# Patient Record
Sex: Female | Born: 1975 | Race: Black or African American | Hispanic: No | Marital: Married | Smoking: Never smoker
Health system: Southern US, Community
[De-identification: ages and names within clinical notes are randomized; demographics above are authoritative.]

## PROBLEM LIST (undated history)

## (undated) DIAGNOSIS — J383 Other diseases of vocal cords: Secondary | ICD-10-CM

## (undated) DIAGNOSIS — D649 Anemia, unspecified: Secondary | ICD-10-CM

## (undated) DIAGNOSIS — R600 Localized edema: Secondary | ICD-10-CM

## (undated) HISTORY — PX: NO PAST SURGERIES: SHX2092

---

## 2001-06-22 ENCOUNTER — Other Ambulatory Visit: Admission: RE | Admit: 2001-06-22 | Discharge: 2001-06-22 | Payer: Self-pay | Admitting: Internal Medicine

## 2002-12-21 ENCOUNTER — Emergency Department (HOSPITAL_COMMUNITY): Admission: AD | Admit: 2002-12-21 | Discharge: 2002-12-21 | Payer: Self-pay | Admitting: Family Medicine

## 2003-09-20 ENCOUNTER — Emergency Department (HOSPITAL_COMMUNITY): Admission: EM | Admit: 2003-09-20 | Discharge: 2003-09-20 | Payer: Self-pay | Admitting: Family Medicine

## 2004-10-15 ENCOUNTER — Emergency Department (HOSPITAL_COMMUNITY): Admission: EM | Admit: 2004-10-15 | Discharge: 2004-10-15 | Payer: Self-pay | Admitting: Family Medicine

## 2004-11-04 ENCOUNTER — Emergency Department (HOSPITAL_COMMUNITY): Admission: EM | Admit: 2004-11-04 | Discharge: 2004-11-04 | Payer: Self-pay | Admitting: Family Medicine

## 2005-06-09 ENCOUNTER — Emergency Department (HOSPITAL_COMMUNITY): Admission: EM | Admit: 2005-06-09 | Discharge: 2005-06-09 | Payer: Self-pay | Admitting: Family Medicine

## 2007-10-25 ENCOUNTER — Emergency Department (HOSPITAL_COMMUNITY): Admission: EM | Admit: 2007-10-25 | Discharge: 2007-10-25 | Payer: Self-pay | Admitting: Family Medicine

## 2008-10-13 ENCOUNTER — Emergency Department (HOSPITAL_COMMUNITY): Admission: EM | Admit: 2008-10-13 | Discharge: 2008-10-13 | Payer: Self-pay | Admitting: Family Medicine

## 2010-02-21 ENCOUNTER — Emergency Department (HOSPITAL_COMMUNITY)
Admission: EM | Admit: 2010-02-21 | Discharge: 2010-02-21 | Payer: Self-pay | Source: Home / Self Care | Admitting: Family Medicine

## 2012-01-05 ENCOUNTER — Encounter (HOSPITAL_COMMUNITY): Payer: Self-pay | Admitting: Emergency Medicine

## 2012-01-05 ENCOUNTER — Emergency Department (HOSPITAL_COMMUNITY)
Admission: EM | Admit: 2012-01-05 | Discharge: 2012-01-05 | Disposition: A | Discharge status: Home / Self Care | Payer: 59 | Source: Home / Self Care | Attending: Family Medicine | Admitting: Family Medicine

## 2012-01-05 DIAGNOSIS — J069 Acute upper respiratory infection, unspecified: Secondary | ICD-10-CM

## 2012-01-05 MED ORDER — HYDROCOD POLST-CHLORPHEN POLST 10-8 MG/5ML PO LQCR: 5.0000 mL | Freq: Two times a day (BID) | ORAL | Status: DC

## 2012-01-05 MED ORDER — AZITHROMYCIN 250 MG PO TABS: ORAL_TABLET | ORAL | Status: DC

## 2012-01-05 MED ORDER — IPRATROPIUM BROMIDE 0.06 % NA SOLN: 2.0000 | Freq: Four times a day (QID) | NASAL | Status: DC

## 2012-01-05 NOTE — ED Provider Notes (Signed)
History     CSN: [id number]  Arrival date & time 01/05/12  1759   First MD Initiated Contact with Patient 01/05/12 1902      Chief Complaint  Patient presents with  . Cough    (Consider location/radiation/quality/duration/timing/severity/associated sxs/prior treatment) Patient is a 36 y.o. female presenting with cough. The history is provided by the patient.  Cough This is a new problem. The current episode started more than 1 week ago. The problem occurs constantly. The problem has been gradually worsening. The cough is non-productive. There has been no fever. Associated symptoms include rhinorrhea and sore throat. Pertinent negatives include no shortness of breath and no wheezing. She is not a smoker.    History reviewed. No pertinent past medical history.  History reviewed. No pertinent past surgical history.  No family history on file.  History  Substance Use Topics  . Smoking status: Never Smoker   . Smokeless tobacco: Not on file  . Alcohol Use: No    OB History    Grav Para Term Preterm Abortions TAB SAB Ect Mult Living                  Review of Systems  Constitutional: Negative.   HENT: Positive for congestion, sore throat, rhinorrhea and postnasal drip.   Respiratory: Positive for cough. Negative for shortness of breath and wheezing.     Allergies  Review of patient's allergies indicates no known allergies.  Home Medications   Current Outpatient Rx  Name  Route  Sig  Dispense  Refill  . GUAIFENESIN ER 600 MG PO TB12   Oral   Take 1,200 mg by mouth 2 (two) times daily.         Marland Kitchen OVER THE COUNTER MEDICATION      Cough syrups         . AZITHROMYCIN 250 MG PO TABS      Take as directed on pack   6 each   0   . HYDROCOD POLST-CPM POLST ER 10-8 MG/5ML PO LQCR   Oral   Take 5 mLs by mouth every 12 (twelve) hours.   115 mL   0   . IPRATROPIUM BROMIDE 0.06 % NA SOLN   Nasal   Place 2 sprays into the nose 4 (four) times daily.   15 mL  1     BP 111/69  Pulse 81  Temp 98.7 F (37.1 C) (Oral)  Resp 18  SpO2 99%  LMP 12/22/2011  Physical Exam  Nursing note and vitals reviewed. Constitutional: She is oriented to person, place, and time. She appears well-developed and well-nourished.  HENT:  Head: Normocephalic.  Right Ear: External ear normal.  Left Ear: External ear normal.  Nose: Mucosal edema and rhinorrhea present.  Mouth/Throat: Uvula is midline and mucous membranes are normal. Posterior oropharyngeal erythema present.  Neck: Normal range of motion. Neck supple.  Cardiovascular: Normal rate, regular rhythm, normal heart sounds and intact distal pulses.   Pulmonary/Chest: Breath sounds normal.  Lymphadenopathy:    She has no cervical adenopathy.  Neurological: She is alert and oriented to person, place, and time.  Skin: Skin is warm and dry.    ED Course  Procedures (including critical care time)  Labs Reviewed - No data to display No results found.   1. URI (upper respiratory infection)       MDM         Linna Hoff, MD 01/05/12 [phone]

## 2012-01-05 NOTE — ED Notes (Signed)
Placed patient in gown.  Provided warm blankets

## 2012-01-05 NOTE — ED Notes (Signed)
Cough, runny nose for approx one week, yesterday congestion started breaking up, bad taste and yellow phlegm.  Denies fever

## 2013-02-16 ENCOUNTER — Emergency Department (HOSPITAL_COMMUNITY)
Admission: EM | Admit: 2013-02-16 | Discharge: 2013-02-16 | Disposition: A | Discharge status: Home / Self Care | Payer: 59 | Source: Home / Self Care | Attending: Emergency Medicine | Admitting: Emergency Medicine

## 2013-02-16 ENCOUNTER — Encounter (HOSPITAL_COMMUNITY): Payer: Self-pay | Admitting: Emergency Medicine

## 2013-02-16 DIAGNOSIS — R69 Illness, unspecified: Principal | ICD-10-CM

## 2013-02-16 DIAGNOSIS — J111 Influenza due to unidentified influenza virus with other respiratory manifestations: Secondary | ICD-10-CM

## 2013-02-16 LAB — POCT RAPID STREP A: STREPTOCOCCUS, GROUP A SCREEN (DIRECT): NEGATIVE

## 2013-02-16 MED ORDER — IPRATROPIUM BROMIDE 0.06 % NA SOLN: 2.0000 | Freq: Four times a day (QID) | NASAL | Status: DC

## 2013-02-16 MED ORDER — HYDROCOD POLST-CHLORPHEN POLST 10-8 MG/5ML PO LQCR: 5.0000 mL | Freq: Two times a day (BID) | ORAL | Status: DC | PRN

## 2013-02-16 MED ORDER — OSELTAMIVIR PHOSPHATE 75 MG PO CAPS: 75.0000 mg | ORAL_CAPSULE | Freq: Two times a day (BID) | ORAL | Status: DC

## 2013-02-16 NOTE — ED Notes (Signed)
Reports onset of symptoms occurred yesterday.  Scratchy throat, headache, stuffy ears, non - productive cough.  Woke this am feeling bad all over

## 2013-02-16 NOTE — ED Provider Notes (Signed)
  Chief Complaint   Chief Complaint  Patient presents with  . URI    History of Present Illness   [name] is a 38 year old female who has had a two-day history of nonproductive cough, sore throat, aching in the ears, headache, myalgias, chills, nasal congestion, and clear rhinorrhea. She denies any fever or GI symptoms. 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  Review of Systems   Other than as noted above, the patient denies any of the following symptoms: Systemic:  No fevers, chills, sweats, or myalgias. Eye:  No redness or discharge. ENT:  No ear pain, headache, nasal congestion, drainage, sinus pressure, or sore throat. Neck:  No neck pain, stiffness, or swollen glands. Lungs:  No cough, sputum production, hemoptysis, wheezing, chest tightness, shortness of breath or chest pain. GI:  No abdominal pain, nausea, vomiting or diarrhea.  [place]   Past medical history, family history, social history, meds, and allergies were reviewed.  Physical exam   Vital signs:  BP 118/61  Pulse 79  Temp(Src) 98.8 F (37.1 C) (Oral)  Resp 17  SpO2 99%  LMP 02/09/2013 General:  Alert and oriented.  In no distress.  Skin warm and dry. Eye:  No conjunctival injection or drainage. Lids were normal. ENT:  TMs and canals were normal, without erythema or inflammation.  Nasal mucosa was clear and uncongested, without drainage.  Mucous membranes were moist.  Pharynx was clear with no exudate or drainage.  There were no oral ulcerations or lesions. Neck:  Supple, no adenopathy, tenderness or mass. Lungs:  No respiratory distress.  Lungs were clear to auscultation, without wheezes, rales or rhonchi.  Breath sounds were clear and equal bilaterally.  Heart:  Regular rhythm, without gallops, murmers or rubs. Skin:  Clear, warm, and dry, without rash or lesions.  Labs   Results for orders placed during the hospital encounter of 02/16/13  POCT RAPID  STREP A ([facility] ONLY)      Result Value Range   Streptococcus, Group A Screen (Direct) NEGATIVE  NEGATIVE     Radiology   No results found.  Assessment     The encounter diagnosis was Influenza-like illness.  Plan    1.  Meds:  The following meds were prescribed:   New Prescriptions   CHLORPHENIRAMINE-HYDROCODONE (TUSSIONEX) 10-8 MG/5ML LQCR    Take 5 mLs by mouth every 12 (twelve) hours as needed for cough.   IPRATROPIUM (ATROVENT) 0.06 % NASAL SPRAY    Place 2 sprays into both nostrils 4 (four) times daily.   OSELTAMIVIR (TAMIFLU) 75 MG CAPSULE    Take 1 capsule (75 mg total) by mouth every 12 (twelve) hours.    2.  Patient Education/Counseling:  The patient was given appropriate handouts, self care instructions, and instructed in symptomatic relief.  Instructed to get extra fluids, rest, and use a cool mist vaporizer.   3.  Follow up:  The patient was told to follow up here if no better in 3 to 4 days, or sooner if becoming worse in any way, and given some red flag symptoms such as increasing fever, difficulty breathing, chest pain, or persistent vomiting which would prompt immediate return.  Follow up here as needed.      Harden Mo, MD 02/16/13 [phone]

## 2013-02-16 NOTE — Discharge Instructions (Signed)

## 2013-02-18 LAB — CULTURE, GROUP A STREP

## 2014-01-03 ENCOUNTER — Emergency Department (HOSPITAL_COMMUNITY)
Admission: EM | Admit: 2014-01-03 | Discharge: 2014-01-03 | Disposition: A | Discharge status: Home / Self Care | Payer: 59 | Source: Home / Self Care | Attending: Family Medicine | Admitting: Family Medicine

## 2014-01-03 ENCOUNTER — Encounter (HOSPITAL_COMMUNITY): Payer: Self-pay | Admitting: Emergency Medicine

## 2014-01-03 DIAGNOSIS — J40 Bronchitis, not specified as acute or chronic: Secondary | ICD-10-CM

## 2014-01-03 MED ORDER — PREDNISONE 10 MG PO TABS: 30.0000 mg | ORAL_TABLET | Freq: Every day | ORAL | Status: DC

## 2014-01-03 MED ORDER — GUAIFENESIN-CODEINE 100-10 MG/5ML PO SOLN: 5.0000 mL | Freq: Every evening | ORAL | Status: DC | PRN

## 2014-01-03 MED ORDER — IPRATROPIUM BROMIDE 0.06 % NA SOLN: 2.0000 | Freq: Four times a day (QID) | NASAL | Status: DC

## 2014-01-03 NOTE — ED Notes (Signed)
C/o  persistent non productive cough.  Cough worse at night.    Pain in ears with swallowing.  Sore throat.  Post nasal drainage. Symptoms present x 2 to 3 days.  No meds tried.  Denies fever, n/v/d.

## 2014-01-03 NOTE — Discharge Instructions (Signed)
Thank you for coming in today. °Call or go to the emergency room if you get worse, have trouble breathing, have chest pains, or palpitations.  °Do not drive after taking codeine ° °Acute Bronchitis °Bronchitis is inflammation of the airways that extend from the windpipe into the lungs (bronchi). The inflammation often causes mucus to develop. This leads to a cough, which is the most common symptom of bronchitis.  °In acute bronchitis, the condition usually develops suddenly and goes away over time, usually in a couple weeks. Smoking, allergies, and asthma can make bronchitis worse. Repeated episodes of bronchitis may cause further lung problems.  °CAUSES °Acute bronchitis is most often caused by the same virus that causes a cold. The virus can spread from person to person (contagious) through coughing, sneezing, and touching contaminated objects. °SIGNS AND SYMPTOMS  °· Cough.   °· Fever.   °· Coughing up mucus.   °· Body aches.   °· Chest congestion.   °· Chills.   °· Shortness of breath.   °· Sore throat.   °DIAGNOSIS  °Acute bronchitis is usually diagnosed through a physical exam. Your health care provider will also ask you questions about your medical history. Tests, such as chest X-rays, are sometimes done to rule out other conditions.  °TREATMENT  °Acute bronchitis usually goes away in a couple weeks. Oftentimes, no medical treatment is necessary. Medicines are sometimes given for relief of fever or cough. Antibiotic medicines are usually not needed but may be prescribed in certain situations. In some cases, an inhaler may be recommended to help reduce shortness of breath and control the cough. A cool mist vaporizer may also be used to help thin bronchial secretions and make it easier to clear the chest.  °HOME CARE INSTRUCTIONS °· Get plenty of rest.   °· Drink enough fluids to keep your urine clear or pale yellow (unless you have a medical condition that requires fluid restriction). Increasing fluids may  help thin your respiratory secretions (sputum) and reduce chest congestion, and it will prevent dehydration.   °· Take medicines only as directed by your health care provider. °· If you were prescribed an antibiotic medicine, finish it all even if you start to feel better. °· Avoid smoking and secondhand smoke. Exposure to cigarette smoke or irritating chemicals will make bronchitis worse. If you are a smoker, consider using nicotine gum or skin patches to help control withdrawal symptoms. Quitting smoking will help your lungs heal faster.   °· Reduce the chances of another bout of acute bronchitis by washing your hands frequently, avoiding people with cold symptoms, and trying not to touch your hands to your mouth, nose, or eyes.   °· Keep all follow-up visits as directed by your health care provider.   °SEEK MEDICAL CARE IF: °Your symptoms do not improve after 1 week of treatment.  °SEEK IMMEDIATE MEDICAL CARE IF: °· You develop an increased fever or chills.   °· You have chest pain.   °· You have severe shortness of breath. °· You have bloody sputum.   °· You develop dehydration. °· You faint or repeatedly feel like you are going to pass out. °· You develop repeated vomiting. °· You develop a severe headache. °MAKE SURE YOU:  °· Understand these instructions. °· Will watch your condition. °· Will get help right away if you are not doing well or get worse. °Document Released: 02/28/2004 Document Revised: 06/06/2013 Document Reviewed: 07/13/2012 °[employer]® Patient Information ©2015 [employer]. This information is not intended to replace advice given to you by your health care provider. Make sure you discuss   questions you have with your health care provider.

## 2014-01-03 NOTE — ED Provider Notes (Signed)
[name] is a 38 y.o. female who presents to Urgent Care today for cough and congestion. Patient is a 2 to three-day history of cough congestion sore throat. Patient additionally notes a postnasal drip. The cough is nonproductive. She has not tried any medications yet. No vomiting or diarrhea.   History reviewed. No pertinent past medical history. History reviewed. No pertinent past surgical history. History  Substance Use Topics  . Smoking status: Never Smoker   . Smokeless tobacco: Not on file  . Alcohol Use: No   ROS as above Medications: No current facility-administered medications for this encounter.   Current Outpatient Prescriptions  Medication Sig Dispense Refill  . guaiFENesin-codeine 100-10 MG/5ML syrup Take 5 mLs by mouth at bedtime as needed for cough. 120 mL 0  . ipratropium (ATROVENT) 0.06 % nasal spray Place 2 sprays into both nostrils 4 (four) times daily. 15 mL 1  . OVER THE COUNTER MEDICATION Cough syrups    . predniSONE (DELTASONE) 10 MG tablet Take 3 tablets (30 mg total) by mouth daily. 15 tablet 0   No Known Allergies   Exam:  BP 114/68 mmHg  Pulse 63  Temp(Src) 98.4 F (36.9 C) (Oral)  Resp 16  SpO2 97%  LMP 12/27/2013 Gen: Well NAD HEENT: EOMI,  MMM posterior pharynx with cobblestoning. Normal tympanic membranes bilaterally. Lungs: Normal work of breathing. CTABL Heart: RRR no MRG Abd: NABS, Soft. Nondistended, Nontender Exts: Brisk capillary refill, warm and well perfused.   No results found for this or any previous visit (from the past 24 hour(s)). No results found.  Assessment and Plan: 38 y.o. female with bronchitis. Treatment with prednisone Atrovent nasal spray and codeine containing cough medication.  Discussed warning signs or symptoms. Please see discharge instructions. Patient expresses understanding.     Gregor Hams, MD 01/03/14 [phone]

## 2014-10-27 ENCOUNTER — Other Ambulatory Visit: Payer: Self-pay | Admitting: Internal Medicine

## 2014-10-27 DIAGNOSIS — R1900 Intra-abdominal and pelvic swelling, mass and lump, unspecified site: Secondary | ICD-10-CM

## 2014-11-04 DIAGNOSIS — J383 Other diseases of vocal cords: Secondary | ICD-10-CM

## 2014-11-04 HISTORY — DX: Other diseases of vocal cords: J38.3

## 2014-11-07 ENCOUNTER — Ambulatory Visit
Admission: RE | Admit: 2014-11-07 | Discharge: 2014-11-07 | Disposition: A | Discharge status: Home / Self Care | Payer: BLUE CROSS/BLUE SHIELD | Source: Ambulatory Visit | Attending: Internal Medicine | Admitting: Internal Medicine

## 2014-11-07 ENCOUNTER — Encounter (INDEPENDENT_AMBULATORY_CARE_PROVIDER_SITE_OTHER): Payer: Self-pay

## 2014-11-07 DIAGNOSIS — R1900 Intra-abdominal and pelvic swelling, mass and lump, unspecified site: Secondary | ICD-10-CM

## 2014-11-28 ENCOUNTER — Encounter (HOSPITAL_BASED_OUTPATIENT_CLINIC_OR_DEPARTMENT_OTHER): Payer: Self-pay | Admitting: *Deleted

## 2014-11-29 ENCOUNTER — Ambulatory Visit: Payer: Self-pay | Admitting: Otolaryngology

## 2014-11-29 NOTE — H&P (Signed)
Assessment  Hoarseness (784.42) (R49.0). 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 Discussed  Right vocal cord lesion, either granuloma or hemorrhagic cyst. It was likely caused by trauma from excessive voice use. Recommend microlaryngoscopy with laser removal of this at her convenience. Reason For Visit  [name] is here today at the kind request of [name] for consultation and opinion. Hoarseness for over 3 months. HPI  Hoarseness for about 3 months. She had this once before in the past but it got better very quickly. 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 She doesn't smoke or drink. She denies heartburn. She denies any pain or trouble swallowing. Allergies  No Known Drug Allergies. Current Meds  Levocetirizine Dihydrochloride 5 MG Oral Tablet;TAKE 1 TABLET BY MOUTH EVERY DAY AS NEEDED; RPT Multi Vitamin Daily Oral Tablet;; RPT. Active Problems  Fibroids   (218.9) (D25.9). Family Hx  Family history of diabetes mellitus: Father (V18.0) (Z83.3). Personal Hx  Never a smoker. ROS  Systemic: Not feeling tired (fatigue).  No fever, no night sweats, and no recent weight loss. Head: No headache. Eyes: No eye symptoms. Otolaryngeal: No hearing loss, no earache, no tinnitus, and no purulent nasal discharge.  No nasal passage blockage (stuffiness), no snoring, no sneezing, no hoarseness, and no sore throat. Cardiovascular: No chest pain or discomfort  and no palpitations. Pulmonary: No dyspnea, no cough, and no wheezing. Gastrointestinal: No dysphagia  and no heartburn.  No nausea, no abdominal pain, and no melena.  No diarrhea. Genitourinary: No dysuria. Endocrine: No muscle weakness. Musculoskeletal: No calf muscle cramps, no arthralgias, and no soft tissue swelling. Neurological: No dizziness, no fainting, no tingling, and no numbness. Psychological: No anxiety  and no depression. Skin: No rash. 12  system ROS was obtained and reviewed on the Health Maintenance form dated today.  Positive responses are shown above.  If the symptom is not checked, the patient has denied it. Vital Signs   Recorded by Dan Maker on 01 Nov 2014 02:26 PM BP:114/70,  BSA Calculated: 2.03 ,  BMI Calculated: 30.86 ,  Weight: 200 lb , BMI: 30.9 kg/m2,  Height: 5 ft 7.5 in. Physical Exam  APPEARANCE: Well developed, well nourished, in no acute distress.  Normal affect, in a pleasant mood.  Oriented to time, place and person. COMMUNICATION: Coarse and breathy voice   HEAD & FACE:  No scars, lesions or masses of head and face.  Sinuses nontender to palpation.  Salivary glands without mass or tenderness.  Facial strength symmetric.  No facial lesion, scars, or mass. EYES: EOMI with normal primary gaze alignment. Visual acuity grossly intact.  PERRLA EXTERNAL EAR & NOSE: No scars, lesions or masses  EAC & TYMPANIC MEMBRANE:  EAC shows no obstructing lesions or debris and tympanic membranes are normal bilaterally with good movement to insufflation. GROSS HEARING: Normal   TMJ:  Nontender  INTRANASAL EXAM: No polyps or purulence.  NASOPHARYNX: Normal, without lesions. LIPS, TEETH & GUMS: No lip lesions, normal dentition and normal gums. ORAL CAVITY/OROPHARYNX:  Oral mucosa moist without lesion or asymmetry of the palate, tongue, tonsil or posterior pharynx. Indirect laryngoscopy inadequate. NECK:  Supple without adenopathy or mass. THYROID:  Normal with no masses palpable.  NEUROLOGIC:  No gross CN deficits. No nystagmus noted.   LYMPHATIC:  No enlarged nodes palpable. Procedure  Fiberoptic Laryngoscopy Name: [name]     Age: 39 year  The risks and benefits of this procedure have been thoroughly discussed with the patient/parent.  The most commons risks outlined included but were not limited to: injury  to the nasal mucosa or throat irritation.  The patient/parent was further informed that there are  other less common risks.  The patient/parent was given the opportunity to ask questions and all such questions were answered to the patient/parent's satisfaction.  Patient/parent acknowledged the risks and has agreed to proceed.   Performing Provider: Izora Gala The risks of the procedure are minimal and were discussed with the patient today. Pre-op Diagnosis: hoarseness  Post-op Diagnosis:Same Allergy:  reviewed allergies as listed Nasal Prep:Lidocaine/Afrin   Procedure:     With the patient seated in the exam chair, the R nasal cavity was intubated with the flexible laryngoscope.  The nasal cavity mucosa, nasopharynx, hypopharynx and larynx were all examined with findings as noted below.  The scope was then removed.  The patient tolerated the procedure well without complication or blood loss (unless indicated in findings).   FINDINGS:   Either granuloma or hemorrhagic cyst arising from the right mid vocal fold. Remainder of the larynx was completely normal.  . Signature  Electronically signed by : Izora Gala  M.D.; 11/01/2014 2:55 PM EST.

## 2014-11-30 ENCOUNTER — Ambulatory Visit (HOSPITAL_BASED_OUTPATIENT_CLINIC_OR_DEPARTMENT_OTHER)
Admission: RE | Admit: 2014-11-30 | Discharge: 2014-11-30 | Disposition: A | Discharge status: Home / Self Care | Payer: BLUE CROSS/BLUE SHIELD | Source: Ambulatory Visit | Attending: Otolaryngology | Admitting: Otolaryngology

## 2014-11-30 ENCOUNTER — Encounter (HOSPITAL_BASED_OUTPATIENT_CLINIC_OR_DEPARTMENT_OTHER)
Admission: RE | Disposition: A | Discharge status: Home / Self Care | Payer: Self-pay | Source: Ambulatory Visit | Attending: Otolaryngology

## 2014-11-30 ENCOUNTER — Encounter (HOSPITAL_BASED_OUTPATIENT_CLINIC_OR_DEPARTMENT_OTHER): Payer: Self-pay

## 2014-11-30 ENCOUNTER — Ambulatory Visit (HOSPITAL_BASED_OUTPATIENT_CLINIC_OR_DEPARTMENT_OTHER): Payer: BLUE CROSS/BLUE SHIELD | Admitting: Anesthesiology

## 2014-11-30 DIAGNOSIS — J383 Other diseases of vocal cords: Secondary | ICD-10-CM | POA: Diagnosis not present

## 2014-11-30 HISTORY — PX: MICROLARYNGOSCOPY: SHX5208

## 2014-11-30 HISTORY — DX: Other diseases of vocal cords: J38.3

## 2014-11-30 SURGERY — MICROLARYNGOSCOPY
Anesthesia: General | Site: Throat

## 2014-11-30 MED ORDER — PROPOFOL 500 MG/50ML IV EMUL
INTRAVENOUS | Status: AC
  Filled 2014-11-30: qty 50

## 2014-11-30 MED ORDER — ONDANSETRON HCL 4 MG/2ML IJ SOLN: 4.0000 mg | Freq: Once | INTRAMUSCULAR | Status: DC | PRN

## 2014-11-30 MED ORDER — FENTANYL CITRATE (PF) 100 MCG/2ML IJ SOLN
50.0000 ug | INTRAMUSCULAR | Status: DC | PRN
  Administered 2014-11-30: 100 ug via INTRAVENOUS

## 2014-11-30 MED ORDER — IBUPROFEN 200 MG PO TABS
ORAL_TABLET | ORAL | Status: AC
  Filled 2014-11-30: qty 3

## 2014-11-30 MED ORDER — GLYCOPYRROLATE 0.2 MG/ML IJ SOLN: 0.2000 mg | Freq: Once | INTRAMUSCULAR | Status: DC | PRN

## 2014-11-30 MED ORDER — LIDOCAINE-EPINEPHRINE 1 %-1:100000 IJ SOLN
INTRAMUSCULAR | Status: AC
  Filled 2014-11-30: qty 1

## 2014-11-30 MED ORDER — BUPIVACAINE HCL (PF) 0.25 % IJ SOLN
INTRAMUSCULAR | Status: AC
  Filled 2014-11-30: qty 30

## 2014-11-30 MED ORDER — LACTATED RINGERS IV SOLN
INTRAVENOUS | Status: DC
  Administered 2014-11-30 (×2): via INTRAVENOUS

## 2014-11-30 MED ORDER — SUCCINYLCHOLINE CHLORIDE 20 MG/ML IJ SOLN
INTRAMUSCULAR | Status: DC | PRN
  Administered 2014-11-30: 100 mg via INTRAVENOUS

## 2014-11-30 MED ORDER — MIDAZOLAM HCL 2 MG/2ML IJ SOLN
INTRAMUSCULAR | Status: AC
  Filled 2014-11-30: qty 4

## 2014-11-30 MED ORDER — MIDAZOLAM HCL 2 MG/2ML IJ SOLN
1.0000 mg | INTRAMUSCULAR | Status: DC | PRN
  Administered 2014-11-30: 2 mg via INTRAVENOUS

## 2014-11-30 MED ORDER — LIDOCAINE-EPINEPHRINE 1 %-1:100000 IJ SOLN
INTRAMUSCULAR | Status: DC | PRN
  Administered 2014-11-30: .2 mL

## 2014-11-30 MED ORDER — EPINEPHRINE HCL 1 MG/ML IJ SOLN
INTRAMUSCULAR | Status: DC | PRN
  Administered 2014-11-30: .2 mL

## 2014-11-30 MED ORDER — ONDANSETRON HCL 4 MG/2ML IJ SOLN
INTRAMUSCULAR | Status: DC | PRN
  Administered 2014-11-30: 4 mg via INTRAVENOUS

## 2014-11-30 MED ORDER — SCOPOLAMINE 1 MG/3DAYS TD PT72: 1.0000 | MEDICATED_PATCH | Freq: Once | TRANSDERMAL | Status: DC | PRN

## 2014-11-30 MED ORDER — PROPOFOL 500 MG/50ML IV EMUL
INTRAVENOUS | Status: DC | PRN
  Administered 2014-11-30: 100 ug/kg/min via INTRAVENOUS

## 2014-11-30 MED ORDER — ARTIFICIAL TEARS OP OINT
TOPICAL_OINTMENT | OPHTHALMIC | Status: AC
  Filled 2014-11-30: qty 3.5

## 2014-11-30 MED ORDER — LIDOCAINE HCL (CARDIAC) 20 MG/ML IV SOLN
INTRAVENOUS | Status: AC
  Filled 2014-11-30: qty 5

## 2014-11-30 MED ORDER — FENTANYL CITRATE (PF) 100 MCG/2ML IJ SOLN
INTRAMUSCULAR | Status: AC
  Filled 2014-11-30: qty 4

## 2014-11-30 MED ORDER — PROPOFOL 10 MG/ML IV BOLUS
INTRAVENOUS | Status: AC
Start: 2014-11-30 — End: 2014-11-30
  Filled 2014-11-30: qty 20

## 2014-11-30 MED ORDER — ONDANSETRON HCL 4 MG/2ML IJ SOLN
INTRAMUSCULAR | Status: AC
  Filled 2014-11-30: qty 2

## 2014-11-30 MED ORDER — DEXAMETHASONE SODIUM PHOSPHATE 4 MG/ML IJ SOLN
INTRAMUSCULAR | Status: DC | PRN
  Administered 2014-11-30: 10 mg via INTRAVENOUS

## 2014-11-30 MED ORDER — EPINEPHRINE HCL 1 MG/ML IJ SOLN
INTRAMUSCULAR | Status: AC
  Filled 2014-11-30: qty 1

## 2014-11-30 MED ORDER — DEXAMETHASONE SODIUM PHOSPHATE 10 MG/ML IJ SOLN
INTRAMUSCULAR | Status: AC
  Filled 2014-11-30: qty 1

## 2014-11-30 MED ORDER — LIDOCAINE HCL (CARDIAC) 20 MG/ML IV SOLN
INTRAVENOUS | Status: DC | PRN
  Administered 2014-11-30: 50 mg via INTRAVENOUS

## 2014-11-30 MED ORDER — METHYLENE BLUE 1 % INJ SOLN
INTRAMUSCULAR | Status: AC
  Filled 2014-11-30: qty 10

## 2014-11-30 MED ORDER — IBUPROFEN 600 MG PO TABS
600.0000 mg | ORAL_TABLET | Freq: Once | ORAL | Status: AC
  Administered 2014-11-30: 600 mg via ORAL

## 2014-11-30 MED ORDER — MEPERIDINE HCL 25 MG/ML IJ SOLN: 6.2500 mg | INTRAMUSCULAR | Status: DC | PRN

## 2014-11-30 MED ORDER — PROPOFOL 10 MG/ML IV BOLUS
INTRAVENOUS | Status: DC | PRN
  Administered 2014-11-30: 200 mg via INTRAVENOUS

## 2014-11-30 MED ORDER — SUCCINYLCHOLINE CHLORIDE 20 MG/ML IJ SOLN
INTRAMUSCULAR | Status: AC
  Filled 2014-11-30: qty 1

## 2014-11-30 MED ORDER — HYDROMORPHONE HCL 1 MG/ML IJ SOLN: 0.2500 mg | INTRAMUSCULAR | Status: DC | PRN

## 2014-11-30 MED ORDER — BUPIVACAINE-EPINEPHRINE (PF) 0.25% -1:200000 IJ SOLN
INTRAMUSCULAR | Status: AC
  Filled 2014-11-30: qty 30

## 2014-11-30 SURGICAL SUPPLY — 27 items
CANISTER SUCT 1200ML W/VALVE (MISCELLANEOUS) ×4 IMPLANT
GLOVE ECLIPSE 7.5 STRL STRAW (GLOVE) ×4 IMPLANT
GLOVE SURG SS PI 7.0 STRL IVOR (GLOVE) ×3 IMPLANT
GOWN STRL REUS W/ TWL LRG LVL3 (GOWN DISPOSABLE) IMPLANT
GOWN STRL REUS W/ TWL XL LVL3 (GOWN DISPOSABLE) IMPLANT
GOWN STRL REUS W/TWL LRG LVL3 (GOWN DISPOSABLE)
GOWN STRL REUS W/TWL XL LVL3 (GOWN DISPOSABLE)
GUARD TEETH (MISCELLANEOUS) ×3 IMPLANT
MARKER SKIN DUAL TIP RULER LAB (MISCELLANEOUS) IMPLANT
NDL HYPO 18GX1.5 BLUNT FILL (NEEDLE) ×1 IMPLANT
NDL SPNL 22GX7 QUINCKE BK (NEEDLE) IMPLANT
NEEDLE HYPO 18GX1.5 BLUNT FILL (NEEDLE) ×4 IMPLANT
NEEDLE SPNL 22GX7 QUINCKE BK (NEEDLE) ×4 IMPLANT
NS IRRIG 1000ML POUR BTL (IV SOLUTION) ×4 IMPLANT
PATTIES SURGICAL .5 X3 (DISPOSABLE) ×4 IMPLANT
REDUCTION FITTING 1/4 IN (FILTER) IMPLANT
SHEET MEDIUM DRAPE 40X70 STRL (DRAPES) ×4 IMPLANT
SLEEVE SCD COMPRESS KNEE MED (MISCELLANEOUS) ×3 IMPLANT
SOLUTION BUTLER CLEAR DIP (MISCELLANEOUS) ×4 IMPLANT
SPONGE GAUZE 4X4 12PLY STER LF (GAUZE/BANDAGES/DRESSINGS) ×8 IMPLANT
SURGILUBE 2OZ TUBE FLIPTOP (MISCELLANEOUS) IMPLANT
SYR 5ML LL (SYRINGE) ×4 IMPLANT
SYR CONTROL 10ML LL (SYRINGE) IMPLANT
SYR TB 1ML LL NO SAFETY (SYRINGE) IMPLANT
TOWEL OR 17X24 6PK STRL BLUE (TOWEL DISPOSABLE) ×4 IMPLANT
TUBE CONNECTING 20'X1/4 (TUBING) ×1
TUBE CONNECTING 20X1/4 (TUBING) ×3 IMPLANT

## 2014-11-30 NOTE — Interval H&P Note (Signed)
History and Physical Interval Note:  11/30/2014 7:21 AM  [name]  has presented today for surgery, with the diagnosis of lesion on vocal cord  The various methods of treatment have been discussed with the patient and family. After consideration of risks, benefits and other options for treatment, the patient has consented to  Procedure(s): MICROLARYNGOSCOPY WITH CO2 LASER AND EXCISION OF VOCAL CORD LESION (N/A) as a surgical intervention .  [name] history has been reviewed, patient examined, no change in status, stable for surgery.  I have reviewed the patient's chart and labs.  Questions were answered to the patient's satisfaction.     Amando Ishikawa

## 2014-11-30 NOTE — Anesthesia Postprocedure Evaluation (Signed)
  Anesthesia Post-op Note  Patient: [name]  Procedure(s) Performed: Procedure(s): MICROLARYNGOSCOPY WITH EXCISION OF VOCAL CORD LESION (N/A)  Patient Location: PACU  Anesthesia Type:General  Level of Consciousness: awake and alert   Airway and Oxygen Therapy: Patient Spontanous Breathing  Post-op Pain: none  Post-op Assessment: Post-op Vital signs reviewed              Post-op Vital Signs: Reviewed  Last Vitals:  Filed Vitals:   11/30/14 0955  BP: 118/80  Pulse: 65  Temp: 36.9 C  Resp: 16    Complications: No apparent anesthesia complications

## 2014-11-30 NOTE — Anesthesia Procedure Notes (Signed)
Procedure Name: Intubation Date/Time: 11/30/2014 7:40 AM Performed by: Lieutenant Diego Pre-anesthesia Checklist: Patient identified, Emergency Drugs available, Suction available and Patient being monitored Patient Re-evaluated:Patient Re-evaluated prior to inductionOxygen Delivery Method: Circle System Utilized Preoxygenation: Pre-oxygenation with 100% oxygen Intubation Type: IV induction Ventilation: Mask ventilation without difficulty Laryngoscope Size: Miller and 2 Grade View: Grade I Tube type: Oral Tube size: 6.0 mm Number of attempts: 1 Airway Equipment and Method: Stylet and Oral airway Placement Confirmation: ETT inserted through vocal cords under direct vision,  positive ETCO2 and breath sounds checked- equal and bilateral Tube secured with: Tape Dental Injury: Teeth and Oropharynx as per pre-operative assessment

## 2014-11-30 NOTE — Discharge Instructions (Signed)
No singing, screaming, whispering or, straining your voice for 3 weeks. Minimize talking but when you talk do it in a gentle and relaxed tone as possible.    Post Anesthesia Home Care Instructions  Activity: Get plenty of rest for the remainder of the day. A responsible adult should stay with you for 24 hours following the procedure.  For the next 24 hours, DO NOT: -Drive a car -Paediatric nurse -Drink alcoholic beverages -Take any medication unless instructed by your physician -Make any legal decisions or sign important papers.  Meals: Start with liquid foods such as gelatin or soup. Progress to regular foods as tolerated. Avoid greasy, spicy, heavy foods. If nausea and/or vomiting occur, drink only clear liquids until the nausea and/or vomiting subsides. Call your physician if vomiting continues.  Special Instructions/Symptoms: Your throat may feel dry or sore from the anesthesia or the breathing tube placed in your throat during surgery. If this causes discomfort, gargle with warm salt water. The discomfort should disappear within 24 hours.  If you had a scopolamine patch placed behind your ear for the management of post- operative nausea and/or vomiting:  1. The medication in the patch is effective for 72 hours, after which it should be removed.  Wrap patch in a tissue and discard in the trash. Wash hands thoroughly with soap and water. 2. You may remove the patch earlier than 72 hours if you experience unpleasant side effects which may include dry mouth, dizziness or visual disturbances. 3. Avoid touching the patch. Wash your hands with soap and water after contact with the patch.   Call your surgeon if you experience:   1.  Fever over 101.0. 2.  Inability to urinate. 3.  Nausea and/or vomiting. 4.  Extreme swelling or bruising at the surgical site. 5.  Continued bleeding from the incision. 6.  Increased pain, redness or drainage from the incision. 7.  Problems related to  your pain medication. 8. Any change in color, movement and/or sensation 9. Any problems and/or concerns

## 2014-11-30 NOTE — Anesthesia Preprocedure Evaluation (Signed)
Anesthesia Evaluation  Patient identified by MRN, date of birth, ID band Patient awake    Reviewed: Allergy & Precautions, NPO status , Patient's Chart, lab work & pertinent test results  Airway Mallampati: I  TM Distance: >3 FB Neck ROM: Full    Dental   Pulmonary    Pulmonary exam normal        Cardiovascular Normal cardiovascular exam     Neuro/Psych    GI/Hepatic   Endo/Other    Renal/GU      Musculoskeletal   Abdominal   Peds  Hematology   Anesthesia Other Findings   Reproductive/Obstetrics                             Anesthesia Physical Anesthesia Plan  ASA: II  Anesthesia Plan: General   Post-op Pain Management:    Induction: Intravenous  Airway Management Planned: Oral ETT  Additional Equipment:   Intra-op Plan:   Post-operative Plan: Extubation in OR  Informed Consent:   Plan Discussed with: CRNA and Surgeon  Anesthesia Plan Comments:         Anesthesia Quick Evaluation  

## 2014-11-30 NOTE — Op Note (Signed)
OPERATIVE REPORT  DATE OF SURGERY: 11/30/2014  PATIENT:  [name],  39 y.o. female  PRE-OPERATIVE DIAGNOSIS:  lesion on vocal cord  POST-OPERATIVE DIAGNOSIS:  * No post-op diagnosis entered *  PROCEDURE:  Procedure(s): MICROLARYNGOSCOPY WITH EXCISION OF VOCAL CORD LESION  SURGEON:  Beckie Salts, MD  ASSISTANTS: XXX   ANESTHESIA:   General   EBL:  1 ml  DRAINS: None  LOCAL MEDICATIONS USED:  1% Xylocaine with epinephrine and topical adrenaline  SPECIMEN:  Right anterior vocal cord lesion  COUNTS:  Correct  PROCEDURE DETAILS: The patient was taken to the operating room and placed on the operating table in the supine position. Following induction of general endotracheal anesthesia, the table was turned and the patient was draped in a standard fashion. A maxillary tooth protector was used throughout the case. A Dedo laryngoscope was entered into the oral cavity used to visualize the larynx. The lesion was identified and the anterior cord on the right. There was some corresponding edema on the left side from contact. The microscope was used throughout the case. The scope was attached to the [place] stand with the suspension apparatus. The vocal cord was injected with local anesthetic solution. Microlaryngoscopy scissors were used to excise the lesion in its entirety. Topical adrenaline was used on pledgets. There was minimal mucosal loss. No further treatment was needed. The scope was removed. Patient was awakened extubated and transferred to recovery in stable condition.    PATIENT DISPOSITION:  To PACU, stable

## 2014-11-30 NOTE — Transfer of Care (Signed)
Immediate Anesthesia Transfer of Care Note  Patient: [name]  Procedure(s) Performed: Procedure(s): MICROLARYNGOSCOPY WITH EXCISION OF VOCAL CORD LESION (N/A)  Patient Location: PACU  Anesthesia Type:General  Level of Consciousness: awake  Airway & Oxygen Therapy: Patient Spontanous Breathing and Patient connected to face mask oxygen  Post-op Assessment: Report given to RN and Post -op Vital signs reviewed and stable  Post vital signs: Reviewed and stable  Last Vitals:  Filed Vitals:   11/30/14 0659  BP: 118/68  Pulse: 61  Temp: 36.9 C  Resp: 20    Complications: No apparent anesthesia complications

## 2014-11-30 NOTE — H&P (View-Only) (Signed)
Assessment  Hoarseness (784.42) (R49.0). 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 Discussed  Right vocal cord lesion, either granuloma or hemorrhagic cyst. It was likely caused by trauma from excessive voice use. Recommend microlaryngoscopy with laser removal of this at her convenience. Reason For Visit  [name] is here today at the kind request of [name] for consultation and opinion. Hoarseness for over 3 months. HPI  Hoarseness for about 3 months. She had this once before in the past but it got better very quickly. 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 She doesn't smoke or drink. She denies heartburn. She denies any pain or trouble swallowing. Allergies  No Known Drug Allergies. Current Meds  Levocetirizine Dihydrochloride 5 MG Oral Tablet;TAKE 1 TABLET BY MOUTH EVERY DAY AS NEEDED; RPT Multi Vitamin Daily Oral Tablet;; RPT. Active Problems  Fibroids   (218.9) (D25.9). Family Hx  Family history of diabetes mellitus: Father (V18.0) (Z83.3). Personal Hx  Never a smoker. ROS  Systemic: Not feeling tired (fatigue).  No fever, no night sweats, and no recent weight loss. Head: No headache. Eyes: No eye symptoms. Otolaryngeal: No hearing loss, no earache, no tinnitus, and no purulent nasal discharge.  No nasal passage blockage (stuffiness), no snoring, no sneezing, no hoarseness, and no sore throat. Cardiovascular: No chest pain or discomfort  and no palpitations. Pulmonary: No dyspnea, no cough, and no wheezing. Gastrointestinal: No dysphagia  and no heartburn.  No nausea, no abdominal pain, and no melena.  No diarrhea. Genitourinary: No dysuria. Endocrine: No muscle weakness. Musculoskeletal: No calf muscle cramps, no arthralgias, and no soft tissue swelling. Neurological: No dizziness, no fainting, no tingling, and no numbness. Psychological: No anxiety  and no depression. Skin: No rash. 12  system ROS was obtained and reviewed on the Health Maintenance form dated today.  Positive responses are shown above.  If the symptom is not checked, the patient has denied it. Vital Signs   Recorded by Dan Maker on 01 Nov 2014 02:26 PM BP:114/70,  BSA Calculated: 2.03 ,  BMI Calculated: 30.86 ,  Weight: 200 lb , BMI: 30.9 kg/m2,  Height: 5 ft 7.5 in. Physical Exam  APPEARANCE: Well developed, well nourished, in no acute distress.  Normal affect, in a pleasant mood.  Oriented to time, place and person. COMMUNICATION: Coarse and breathy voice   HEAD & FACE:  No scars, lesions or masses of head and face.  Sinuses nontender to palpation.  Salivary glands without mass or tenderness.  Facial strength symmetric.  No facial lesion, scars, or mass. EYES: EOMI with normal primary gaze alignment. Visual acuity grossly intact.  PERRLA EXTERNAL EAR & NOSE: No scars, lesions or masses  EAC & TYMPANIC MEMBRANE:  EAC shows no obstructing lesions or debris and tympanic membranes are normal bilaterally with good movement to insufflation. GROSS HEARING: Normal   TMJ:  Nontender  INTRANASAL EXAM: No polyps or purulence.  NASOPHARYNX: Normal, without lesions. LIPS, TEETH & GUMS: No lip lesions, normal dentition and normal gums. ORAL CAVITY/OROPHARYNX:  Oral mucosa moist without lesion or asymmetry of the palate, tongue, tonsil or posterior pharynx. Indirect laryngoscopy inadequate. NECK:  Supple without adenopathy or mass. THYROID:  Normal with no masses palpable.  NEUROLOGIC:  No gross CN deficits. No nystagmus noted.   LYMPHATIC:  No enlarged nodes palpable. Procedure  Fiberoptic Laryngoscopy Name: Shannette Tabares     Age: 39 year  The risks and benefits of this procedure have been thoroughly discussed with the patient/parent.  The most commons risks outlined included but were not limited to: injury  to the nasal mucosa or throat irritation.  The patient/parent was further informed that there are  other less common risks.  The patient/parent was given the opportunity to ask questions and all such questions were answered to the patient/parent's satisfaction.  Patient/parent acknowledged the risks and has agreed to proceed.   Performing Provider: Izora Gala The risks of the procedure are minimal and were discussed with the patient today. Pre-op Diagnosis: hoarseness  Post-op Diagnosis:Same Allergy:  reviewed allergies as listed Nasal Prep:Lidocaine/Afrin   Procedure:     With the patient seated in the exam chair, the R nasal cavity was intubated with the flexible laryngoscope.  The nasal cavity mucosa, nasopharynx, hypopharynx and larynx were all examined with findings as noted below.  The scope was then removed.  The patient tolerated the procedure well without complication or blood loss (unless indicated in findings).   FINDINGS:   Either granuloma or hemorrhagic cyst arising from the right mid vocal fold. Remainder of the larynx was completely normal.  . Signature  Electronically signed by : Izora Gala  M.D.; 11/01/2014 2:55 PM EST.

## 2014-12-01 ENCOUNTER — Encounter (HOSPITAL_BASED_OUTPATIENT_CLINIC_OR_DEPARTMENT_OTHER): Payer: Self-pay | Admitting: Otolaryngology

## 2015-01-10 ENCOUNTER — Other Ambulatory Visit: Payer: Self-pay | Admitting: Obstetrics and Gynecology

## 2015-02-09 ENCOUNTER — Other Ambulatory Visit (HOSPITAL_COMMUNITY): Payer: Self-pay | Admitting: Obstetrics and Gynecology

## 2015-02-09 DIAGNOSIS — N979 Female infertility, unspecified: Secondary | ICD-10-CM

## 2015-02-13 ENCOUNTER — Ambulatory Visit (HOSPITAL_COMMUNITY)
Admission: RE | Admit: 2015-02-13 | Discharge: 2015-02-13 | Disposition: A | Discharge status: Home / Self Care | Payer: BLUE CROSS/BLUE SHIELD | Source: Ambulatory Visit | Attending: Obstetrics and Gynecology | Admitting: Obstetrics and Gynecology

## 2015-02-13 DIAGNOSIS — D259 Leiomyoma of uterus, unspecified: Secondary | ICD-10-CM | POA: Diagnosis not present

## 2015-02-13 DIAGNOSIS — N979 Female infertility, unspecified: Secondary | ICD-10-CM | POA: Diagnosis not present

## 2015-02-13 MED ORDER — IOHEXOL 300 MG/ML  SOLN
30.0000 mL | Freq: Once | INTRAMUSCULAR | Status: AC | PRN
  Administered 2015-02-13: 30 mL

## 2015-04-26 ENCOUNTER — Other Ambulatory Visit: Payer: Self-pay | Admitting: Obstetrics and Gynecology

## 2015-05-06 NOTE — Anesthesia Preprocedure Evaluation (Addendum)
Anesthesia Evaluation  Patient identified by MRN, date of birth, ID band Patient awake    Reviewed: Allergy & Precautions, NPO status , Patient's Chart, lab work & pertinent test results  Airway Mallampati: I  TM Distance: >3 FB Neck ROM: Full    Dental  (+) Teeth Intact   Pulmonary neg pulmonary ROS,    Pulmonary exam normal breath sounds clear to auscultation       Cardiovascular negative cardio ROS Normal cardiovascular exam Rhythm:Regular     Neuro/Psych negative neurological ROS  negative psych ROS   GI/Hepatic negative GI ROS, Neg liver ROS,   Endo/Other  negative endocrine ROS  Renal/GU negative Renal ROS  negative genitourinary   Musculoskeletal negative musculoskeletal ROS (+)   Abdominal   Peds negative pediatric ROS (+)  Hematology negative hematology ROS (+)   Anesthesia Other Findings Excision lesion vocal cord 2016  Reproductive/Obstetrics negative OB ROS                            Anesthesia Physical Anesthesia Plan  ASA: II  Anesthesia Plan: General   Post-op Pain Management:    Induction: Intravenous  Airway Management Planned: Oral ETT  Additional Equipment:   Intra-op Plan:   Post-operative Plan: Extubation in OR  Informed Consent: I have reviewed the patients History and Physical, chart, labs and discussed the procedure including the risks, benefits and alternatives for the proposed anesthesia with the patient or authorized representative who has indicated his/her understanding and acceptance.     Plan Discussed with:   Anesthesia Plan Comments: (2nd IV, Has had vocal cord nodule removed this past December.  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

## 2015-05-08 NOTE — Patient Instructions (Signed)
Your procedure is scheduled on:  Thursday, May 10, 2015  Enter through the Main Entrance of [facility] at: 11:30 AM  Pick up the phone at the desk and dial [phone].  Call this number if you have problems the morning of surgery: [phone].  Remember: Do NOT eat food:  After Midnight Tonight  Do NOT drink clear liquids after:  9:00 AM day of surgery  Take these medicines the morning of surgery with a SIP OF WATER:  None  Do NOT wear jewelry (body piercing), metal hair clips/bobby pins, make-up, or nail polish. Do NOT wear lotions, powders, or perfumes.  You may wear deodorant. Do NOT shave for 48 hours prior to surgery. Do NOT bring valuables to the hospital. Contacts, dentures, or bridgework may not be worn into surgery.  Leave suitcase in car.  After surgery it may be brought to your room.  For patients admitted to the hospital, checkout time is 11:00 AM the day of discharge.

## 2015-05-09 ENCOUNTER — Encounter (HOSPITAL_COMMUNITY)
Admission: RE | Admit: 2015-05-09 | Discharge: 2015-05-09 | Disposition: A | Discharge status: Home / Self Care | Payer: BLUE CROSS/BLUE SHIELD | Source: Ambulatory Visit | Attending: Obstetrics and Gynecology | Admitting: Obstetrics and Gynecology

## 2015-05-09 ENCOUNTER — Encounter (HOSPITAL_COMMUNITY): Payer: Self-pay

## 2015-05-09 HISTORY — DX: Localized edema: R60.0

## 2015-05-09 HISTORY — DX: Anemia, unspecified: D64.9

## 2015-05-09 LAB — CBC
HEMATOCRIT: 31.9 % — AB (ref 36.0–46.0)
Hemoglobin: 10.5 g/dL — ABNORMAL LOW (ref 12.0–15.0)
MCH: 27.6 pg (ref 26.0–34.0)
MCHC: 32.9 g/dL (ref 30.0–36.0)
MCV: 83.7 fL (ref 78.0–100.0)
PLATELETS: 289 10*3/uL (ref 150–400)
RBC: 3.81 MIL/uL — AB (ref 3.87–5.11)
RDW: 15.4 % (ref 11.5–15.5)
WBC: 5.7 10*3/uL (ref 4.0–10.5)

## 2015-05-09 LAB — ABO/RH: ABO/RH(D): A POS

## 2015-05-10 ENCOUNTER — Inpatient Hospital Stay (HOSPITAL_COMMUNITY): Payer: BLUE CROSS/BLUE SHIELD | Admitting: Anesthesiology

## 2015-05-10 ENCOUNTER — Inpatient Hospital Stay (HOSPITAL_COMMUNITY)
Admission: RE | Admit: 2015-05-10 | Discharge: 2015-05-12 | DRG: 742 | Disposition: A | Discharge status: Home / Self Care | Payer: BLUE CROSS/BLUE SHIELD | Source: Ambulatory Visit | Attending: Obstetrics and Gynecology | Admitting: Obstetrics and Gynecology

## 2015-05-10 ENCOUNTER — Encounter (HOSPITAL_COMMUNITY): Payer: Self-pay

## 2015-05-10 ENCOUNTER — Encounter (HOSPITAL_COMMUNITY)
Admission: RE | Disposition: A | Discharge status: Home / Self Care | Payer: Self-pay | Source: Ambulatory Visit | Attending: Obstetrics and Gynecology

## 2015-05-10 DIAGNOSIS — D509 Iron deficiency anemia, unspecified: Secondary | ICD-10-CM

## 2015-05-10 DIAGNOSIS — N838 Other noninflammatory disorders of ovary, fallopian tube and broad ligament: Secondary | ICD-10-CM | POA: Diagnosis present

## 2015-05-10 DIAGNOSIS — D252 Subserosal leiomyoma of uterus: Principal | ICD-10-CM | POA: Diagnosis present

## 2015-05-10 DIAGNOSIS — N920 Excessive and frequent menstruation with regular cycle: Secondary | ICD-10-CM | POA: Diagnosis present

## 2015-05-10 DIAGNOSIS — D62 Acute posthemorrhagic anemia: Secondary | ICD-10-CM | POA: Diagnosis not present

## 2015-05-10 DIAGNOSIS — N800 Endometriosis of uterus: Secondary | ICD-10-CM | POA: Diagnosis present

## 2015-05-10 DIAGNOSIS — D259 Leiomyoma of uterus, unspecified: Secondary | ICD-10-CM | POA: Diagnosis present

## 2015-05-10 DIAGNOSIS — Z9889 Other specified postprocedural states: Secondary | ICD-10-CM

## 2015-05-10 HISTORY — PX: MYOMECTOMY: SHX85

## 2015-05-10 LAB — PREPARE RBC (CROSSMATCH)

## 2015-05-10 LAB — PREGNANCY, URINE: Preg Test, Ur: NEGATIVE

## 2015-05-10 SURGERY — MYOMECTOMY, ABDOMINAL APPROACH
Anesthesia: General | Site: Abdomen

## 2015-05-10 MED ORDER — SODIUM CHLORIDE 0.9 % IJ SOLN
INTRAMUSCULAR | Status: AC
  Filled 2015-05-10: qty 100

## 2015-05-10 MED ORDER — FENTANYL CITRATE (PF) 250 MCG/5ML IJ SOLN
INTRAMUSCULAR | Status: AC
  Filled 2015-05-10: qty 5

## 2015-05-10 MED ORDER — SODIUM CHLORIDE 0.9 % IJ SOLN
INTRAMUSCULAR | Status: AC
  Filled 2015-05-10: qty 10

## 2015-05-10 MED ORDER — ONDANSETRON HCL 4 MG/2ML IJ SOLN: 4.0000 mg | Freq: Four times a day (QID) | INTRAMUSCULAR | Status: DC | PRN

## 2015-05-10 MED ORDER — MIDAZOLAM HCL 2 MG/2ML IJ SOLN
INTRAMUSCULAR | Status: DC | PRN
Start: 2015-05-10 — End: 2015-05-10
  Administered 2015-05-10: 2 mg via INTRAVENOUS

## 2015-05-10 MED ORDER — DEXAMETHASONE SODIUM PHOSPHATE 4 MG/ML IJ SOLN
INTRAMUSCULAR | Status: AC
  Filled 2015-05-10: qty 1

## 2015-05-10 MED ORDER — PANTOPRAZOLE SODIUM 40 MG PO TBEC
40.0000 mg | DELAYED_RELEASE_TABLET | Freq: Every day | ORAL | Status: DC
  Administered 2015-05-10 – 2015-05-12 (×2): 40 mg via ORAL
  Filled 2015-05-10 (×2): qty 1

## 2015-05-10 MED ORDER — ROCURONIUM BROMIDE 100 MG/10ML IV SOLN
INTRAVENOUS | Status: DC | PRN
Start: 2015-05-10 — End: 2015-05-10
  Administered 2015-05-10 (×2): 10 mg via INTRAVENOUS
  Administered 2015-05-10: 30 mg via INTRAVENOUS
  Administered 2015-05-10: 5 mg via INTRAVENOUS

## 2015-05-10 MED ORDER — DIPHENHYDRAMINE HCL 50 MG/ML IJ SOLN: 12.5000 mg | Freq: Four times a day (QID) | INTRAMUSCULAR | Status: DC | PRN

## 2015-05-10 MED ORDER — PROPOFOL 10 MG/ML IV BOLUS
INTRAVENOUS | Status: AC
  Filled 2015-05-10: qty 20

## 2015-05-10 MED ORDER — BUPIVACAINE HCL (PF) 0.25 % IJ SOLN
INTRAMUSCULAR | Status: AC
  Filled 2015-05-10: qty 30

## 2015-05-10 MED ORDER — ROCURONIUM BROMIDE 100 MG/10ML IV SOLN
INTRAVENOUS | Status: AC
  Filled 2015-05-10: qty 1

## 2015-05-10 MED ORDER — NEOSTIGMINE METHYLSULFATE 10 MG/10ML IV SOLN
INTRAVENOUS | Status: DC | PRN
  Administered 2015-05-10: 2 mg via INTRAVENOUS

## 2015-05-10 MED ORDER — VASOPRESSIN 20 UNIT/ML IV SOLN
INTRAVENOUS | Status: DC | PRN
  Administered 2015-05-10: 30 mL via INTRAMUSCULAR
  Administered 2015-05-10: 20 mL via INTRAMUSCULAR

## 2015-05-10 MED ORDER — ONDANSETRON HCL 4 MG/2ML IJ SOLN
INTRAMUSCULAR | Status: AC
  Filled 2015-05-10: qty 2

## 2015-05-10 MED ORDER — GLYCOPYRROLATE 0.2 MG/ML IJ SOLN
INTRAMUSCULAR | Status: DC | PRN
  Administered 2015-05-10: 0.4 mg via INTRAVENOUS

## 2015-05-10 MED ORDER — FENTANYL CITRATE (PF) 100 MCG/2ML IJ SOLN
INTRAMUSCULAR | Status: AC
  Administered 2015-05-10: 50 ug via INTRAVENOUS
  Filled 2015-05-10: qty 2

## 2015-05-10 MED ORDER — GLYCOPYRROLATE 0.2 MG/ML IJ SOLN
INTRAMUSCULAR | Status: AC
  Filled 2015-05-10: qty 3

## 2015-05-10 MED ORDER — ONDANSETRON HCL 4 MG PO TABS: 4.0000 mg | ORAL_TABLET | Freq: Four times a day (QID) | ORAL | Status: DC | PRN

## 2015-05-10 MED ORDER — CEFAZOLIN SODIUM-DEXTROSE 2-4 GM/100ML-% IV SOLN
2.0000 g | INTRAVENOUS | Status: AC
  Administered 2015-05-10: 2 g via INTRAVENOUS
  Filled 2015-05-10: qty 100

## 2015-05-10 MED ORDER — DEXTROSE IN LACTATED RINGERS 5 % IV SOLN
INTRAVENOUS | Status: DC
Start: 2015-05-10 — End: 2015-05-12
  Administered 2015-05-11: 02:00:00 via INTRAVENOUS

## 2015-05-10 MED ORDER — HYDROMORPHONE HCL 1 MG/ML IJ SOLN
INTRAMUSCULAR | Status: AC
  Filled 2015-05-10: qty 1

## 2015-05-10 MED ORDER — NALOXONE HCL 0.4 MG/ML IJ SOLN: 0.4000 mg | INTRAMUSCULAR | Status: DC | PRN

## 2015-05-10 MED ORDER — FENTANYL CITRATE (PF) 100 MCG/2ML IJ SOLN
INTRAMUSCULAR | Status: DC | PRN
  Administered 2015-05-10: 100 ug via INTRAVENOUS
  Administered 2015-05-10: 50 ug via INTRAVENOUS
  Administered 2015-05-10: 100 ug via INTRAVENOUS

## 2015-05-10 MED ORDER — VASOPRESSIN 20 UNIT/ML IV SOLN
INTRAVENOUS | Status: AC
  Filled 2015-05-10: qty 1

## 2015-05-10 MED ORDER — IBUPROFEN 800 MG PO TABS
800.0000 mg | ORAL_TABLET | Freq: Three times a day (TID) | ORAL | Status: DC | PRN
  Administered 2015-05-12: 800 mg via ORAL
  Filled 2015-05-10: qty 1

## 2015-05-10 MED ORDER — DEXAMETHASONE SODIUM PHOSPHATE 10 MG/ML IJ SOLN
INTRAMUSCULAR | Status: DC | PRN
  Administered 2015-05-10: 10 mg via INTRAVENOUS

## 2015-05-10 MED ORDER — MIDAZOLAM HCL 2 MG/2ML IJ SOLN
INTRAMUSCULAR | Status: AC
  Filled 2015-05-10: qty 2

## 2015-05-10 MED ORDER — SCOPOLAMINE 1 MG/3DAYS TD PT72
1.0000 | MEDICATED_PATCH | Freq: Once | TRANSDERMAL | Status: DC
  Administered 2015-05-10: 1.5 mg via TRANSDERMAL
  Filled 2015-05-10: qty 1

## 2015-05-10 MED ORDER — METHYLENE BLUE 1 % INJ SOLN
INTRAMUSCULAR | Status: AC
  Filled 2015-05-10: qty 1

## 2015-05-10 MED ORDER — LIDOCAINE HCL (CARDIAC) 20 MG/ML IV SOLN
INTRAVENOUS | Status: AC
  Filled 2015-05-10: qty 5

## 2015-05-10 MED ORDER — HYDROMORPHONE HCL 1 MG/ML IJ SOLN: 0.2000 mg | INTRAMUSCULAR | Status: DC | PRN

## 2015-05-10 MED ORDER — LACTATED RINGERS IV SOLN
INTRAVENOUS | Status: DC
  Administered 2015-05-10 (×4): via INTRAVENOUS

## 2015-05-10 MED ORDER — CEFAZOLIN SODIUM-DEXTROSE 2-3 GM-% IV SOLR
INTRAVENOUS | Status: AC
  Filled 2015-05-10: qty 50

## 2015-05-10 MED ORDER — LIDOCAINE HCL (CARDIAC) 20 MG/ML IV SOLN
INTRAVENOUS | Status: DC | PRN
  Administered 2015-05-10: 100 mg via INTRAVENOUS

## 2015-05-10 MED ORDER — ONDANSETRON HCL 4 MG/2ML IJ SOLN
INTRAMUSCULAR | Status: DC | PRN
  Administered 2015-05-10: 4 mg via INTRAVENOUS

## 2015-05-10 MED ORDER — SODIUM CHLORIDE 0.9% FLUSH: 9.0000 mL | INTRAVENOUS | Status: DC | PRN

## 2015-05-10 MED ORDER — MENTHOL 3 MG MT LOZG
1.0000 | LOZENGE | OROMUCOSAL | Status: DC | PRN
  Filled 2015-05-10: qty 9

## 2015-05-10 MED ORDER — SIMETHICONE 80 MG PO CHEW
80.0000 mg | CHEWABLE_TABLET | Freq: Four times a day (QID) | ORAL | Status: DC | PRN
  Administered 2015-05-11 – 2015-05-12 (×4): 80 mg via ORAL
  Filled 2015-05-10 (×4): qty 1

## 2015-05-10 MED ORDER — DIPHENHYDRAMINE HCL 12.5 MG/5ML PO ELIX: 12.5000 mg | ORAL_SOLUTION | Freq: Four times a day (QID) | ORAL | Status: DC | PRN

## 2015-05-10 MED ORDER — OXYCODONE-ACETAMINOPHEN 5-325 MG PO TABS
1.0000 | ORAL_TABLET | ORAL | Status: DC | PRN
Start: 2015-05-10 — End: 2015-05-12
  Administered 2015-05-11 (×2): 2 via ORAL
  Administered 2015-05-12: 1 via ORAL
  Administered 2015-05-12: 2 via ORAL
  Filled 2015-05-10: qty 2
  Filled 2015-05-10: qty 1
  Filled 2015-05-10 (×2): qty 2

## 2015-05-10 MED ORDER — VASOPRESSIN 20 UNIT/ML IV SOLN
INTRAVENOUS | Status: AC
  Filled 2015-05-10: qty 3

## 2015-05-10 MED ORDER — SODIUM CHLORIDE 0.9 % IV SOLN: Freq: Once | INTRAVENOUS | Status: DC

## 2015-05-10 MED ORDER — HYDROMORPHONE 1 MG/ML IV SOLN
INTRAVENOUS | Status: DC
  Administered 2015-05-10: 2.6 mg via INTRAVENOUS
  Administered 2015-05-10: 19:00:00 via INTRAVENOUS
  Administered 2015-05-11 (×2): 1 mg via INTRAVENOUS
  Filled 2015-05-10: qty 25

## 2015-05-10 MED ORDER — SCOPOLAMINE 1 MG/3DAYS TD PT72
MEDICATED_PATCH | TRANSDERMAL | Status: AC
  Administered 2015-05-10: 1.5 mg via TRANSDERMAL
  Filled 2015-05-10: qty 1

## 2015-05-10 MED ORDER — ONDANSETRON HCL 4 MG/2ML IJ SOLN: 4.0000 mg | Freq: Four times a day (QID) | INTRAMUSCULAR | Status: DC

## 2015-05-10 MED ORDER — HYDROMORPHONE HCL 1 MG/ML IJ SOLN
INTRAMUSCULAR | Status: DC | PRN
  Administered 2015-05-10: 1 mg via INTRAVENOUS

## 2015-05-10 MED ORDER — FENTANYL CITRATE (PF) 100 MCG/2ML IJ SOLN
25.0000 ug | INTRAMUSCULAR | Status: DC | PRN
  Administered 2015-05-10 (×3): 50 ug via INTRAVENOUS

## 2015-05-10 MED ORDER — PROPOFOL 10 MG/ML IV BOLUS
INTRAVENOUS | Status: DC | PRN
  Administered 2015-05-10: 160 mg via INTRAVENOUS

## 2015-05-10 MED ORDER — NEOSTIGMINE METHYLSULFATE 10 MG/10ML IV SOLN
INTRAVENOUS | Status: AC
  Filled 2015-05-10: qty 1

## 2015-05-10 MED ORDER — BUPIVACAINE HCL (PF) 0.25 % IJ SOLN
INTRAMUSCULAR | Status: DC | PRN
  Administered 2015-05-10: 10 mL

## 2015-05-10 MED ORDER — MEPERIDINE HCL 25 MG/ML IJ SOLN: 6.2500 mg | INTRAMUSCULAR | Status: DC | PRN

## 2015-05-10 SURGICAL SUPPLY — 45 items
BARRIER ADHS 3X4 INTERCEED (GAUZE/BANDAGES/DRESSINGS) ×7 IMPLANT
BRR ADH 4X3 ABS CNTRL BYND (GAUZE/BANDAGES/DRESSINGS) ×3
CANISTER SUCT 3000ML (MISCELLANEOUS) ×3 IMPLANT
CLOTH BEACON ORANGE TIMEOUT ST (SAFETY) ×3 IMPLANT
CONT PATH 16OZ SNAP LID 3702 (MISCELLANEOUS) ×3 IMPLANT
CONT SPEC PATH 64OZ SNAP LID (MISCELLANEOUS) ×3 IMPLANT
DECANTER SPIKE VIAL GLASS SM (MISCELLANEOUS) ×3 IMPLANT
DRAPE CESAREAN BIRTH W POUCH (DRAPES) ×3 IMPLANT
DRSG OPSITE POSTOP 4X10 (GAUZE/BANDAGES/DRESSINGS) ×3 IMPLANT
DURAPREP 26ML APPLICATOR (WOUND CARE) ×3 IMPLANT
ELECT NDL TIP 2.8 STRL (NEEDLE) ×1 IMPLANT
ELECT NEEDLE TIP 2.8 STRL (NEEDLE) ×3 IMPLANT
FILTER STRAW FLUID ASPIR (MISCELLANEOUS) IMPLANT
GAUZE SPONGE 4X4 16PLY XRAY LF (GAUZE/BANDAGES/DRESSINGS) ×3 IMPLANT
GLOVE BIOGEL PI IND STRL 7.0 (GLOVE) ×3 IMPLANT
GLOVE BIOGEL PI INDICATOR 7.0 (GLOVE) ×6
GLOVE ECLIPSE 6.5 STRL STRAW (GLOVE) ×3 IMPLANT
GOWN STRL REUS W/TWL LRG LVL3 (GOWN DISPOSABLE) ×9 IMPLANT
NDL SPNL 22GX3.5 QUINCKE BK (NEEDLE) ×1 IMPLANT
NEEDLE HYPO 22GX1.5 SAFETY (NEEDLE) ×3 IMPLANT
NEEDLE SPNL 22GX3.5 QUINCKE BK (NEEDLE) ×6 IMPLANT
NS IRRIG 1000ML POUR BTL (IV SOLUTION) ×3 IMPLANT
PACK ABDOMINAL GYN (CUSTOM PROCEDURE TRAY) ×3 IMPLANT
PAD OB MATERNITY 4.3X12.25 (PERSONAL CARE ITEMS) ×3 IMPLANT
RETRACTOR WND ALEXIS 25 LRG (MISCELLANEOUS) IMPLANT
RTRCTR WOUND ALEXIS 25CM LRG (MISCELLANEOUS)
SPONGE GAUZE 4X4 12PLY STER LF (GAUZE/BANDAGES/DRESSINGS) ×3 IMPLANT
STAPLER VISISTAT 35W (STAPLE) IMPLANT
SUT MON AB 3-0 SH 27 (SUTURE) ×9
SUT MON AB 3-0 SH27 (SUTURE) ×3 IMPLANT
SUT MON AB 4-0 PS1 27 (SUTURE) ×6 IMPLANT
SUT PLAIN 2 0 XLH (SUTURE) IMPLANT
SUT VIC AB 0 CT1 18XCR BRD8 (SUTURE) ×1 IMPLANT
SUT VIC AB 0 CT1 36 (SUTURE) ×6 IMPLANT
SUT VIC AB 0 CT1 8-18 (SUTURE) ×6
SUT VIC AB 0 CTX 36 (SUTURE) ×33
SUT VIC AB 0 CTX36XBRD ANBCTRL (SUTURE) IMPLANT
SUT VIC AB 2-0 CT1 27 (SUTURE) ×3
SUT VIC AB 2-0 CT1 TAPERPNT 27 (SUTURE) ×1 IMPLANT
SUT VIC AB 2-0 SH 27 (SUTURE)
SUT VIC AB 2-0 SH 27XBRD (SUTURE) IMPLANT
SUT VIC AB 4-0 PS2 27 (SUTURE) IMPLANT
SYR CONTROL 10ML LL (SYRINGE) ×6 IMPLANT
TOWEL OR 17X24 6PK STRL BLUE (TOWEL DISPOSABLE) ×6 IMPLANT
TRAY FOLEY CATH SILVER 14FR (SET/KITS/TRAYS/PACK) ×3 IMPLANT

## 2015-05-10 NOTE — Brief Op Note (Signed)
05/10/2015  4:32 PM  PATIENT:  [name]  40 y.o. female  PRE-OPERATIVE DIAGNOSIS:  Uterine Fibroids, menorrhagia with regular cycles, IDA  POST-OPERATIVE DIAGNOSIS:  uterine fibroids( IM/SS/pedunculated), menorrhagia with regular cycle, IDA PROCEDURE:  Procedure(s): Exploratory Laparotomy MYOMECTOMY (N/A)  SURGEON:  Surgeon(s) and Role:    * Servando Salina, MD - Primary  PHYSICIAN ASSISTANT:   ASSISTANTS: Laury Deep, CNM   ANESTHESIA:   general FINDINGS: nl tubes and ovaries, small endometriotic implants post, large anterior 14 cm fibroid, 8 cm post SS/Im fibroids, pedunculated fibroid EBL:  Total I/O In: 2600 [I.V.:2600] Out: 950 [Urine:450; Blood:500]  BLOOD ADMINISTERED:none  DRAINS: none   LOCAL MEDICATIONS USED:  MARCAINE     SPECIMEN:  Source of Specimen:  uterine fibroids  DISPOSITION OF SPECIMEN:  PATHOLOGY  COUNTS:  YES  TOURNIQUET:  * No tourniquets in log *  DICTATION: .Other Dictation: Dictation Number [id number]  PLAN OF CARE: Admit to inpatient   PATIENT DISPOSITION:  PACU - hemodynamically stable.   Delay start of Pharmacological VTE agent (>24hrs) due to surgical blood loss or risk of bleeding: no

## 2015-05-10 NOTE — Transfer of Care (Signed)
Immediate Anesthesia Transfer of Care Note  Patient: [name]  Procedure(s) Performed: Procedure(s): Exploratory Laparotomy MYOMECTOMY (N/A)  Patient Location: PACU  Anesthesia Type:General  Level of Consciousness: awake  Airway & Oxygen Therapy: Patient Spontanous Breathing  Post-op Assessment: Report given to PACU RN  Post vital signs: stable  Filed Vitals:   05/10/15 1140  BP: 122/80  Pulse: 60  Temp: 36.8 C  Resp: 20    Complications: No apparent anesthesia complications

## 2015-05-10 NOTE — Anesthesia Postprocedure Evaluation (Signed)
Anesthesia Post Note  Patient: [name]  Procedure(s) Performed: Procedure(s) (LRB): Exploratory Laparotomy MYOMECTOMY (N/A)  Patient location during evaluation: PACU Anesthesia Type: General Level of consciousness: awake Pain management: pain level controlled Vital Signs Assessment: post-procedure vital signs reviewed and stable Respiratory status: spontaneous breathing Cardiovascular status: stable Postop Assessment: no signs of nausea or vomiting Anesthetic complications: no    Last Vitals:  Filed Vitals:   05/10/15 1700 05/10/15 1715  BP: 112/67 111/74  Pulse: 64 68  Temp:    Resp: 12 10    Last Pain:  Filed Vitals:   05/10/15 1725  PainSc: [place]

## 2015-05-10 NOTE — Anesthesia Procedure Notes (Addendum)
Procedure Name: Intubation Date/Time: 05/10/2015 1:33 PM Performed by: Casimer Lanius A Pre-anesthesia Checklist: Patient identified, Emergency Drugs available, Suction available and Patient being monitored Patient Re-evaluated:Patient Re-evaluated prior to inductionOxygen Delivery Method: Circle system utilized and Simple face mask Preoxygenation: Pre-oxygenation with 100% oxygen Intubation Type: IV induction and Inhalational induction Ventilation: Mask ventilation without difficulty Laryngoscope Size: Mac and 3 Grade View: Grade III Tube type: Oral Tube size: 6.0 mm Number of attempts: 1 Airway Equipment and Method: Stylet Placement Confirmation: ETT inserted through vocal cords under direct vision,  positive ETCO2 and breath sounds checked- equal and bilateral Secured at: 22 (right lip) cm Tube secured with: Tape Dental Injury: Teeth and Oropharynx as per pre-operative assessment

## 2015-05-11 ENCOUNTER — Encounter (HOSPITAL_COMMUNITY): Payer: Self-pay | Admitting: Obstetrics and Gynecology

## 2015-05-11 LAB — CBC
HEMATOCRIT: 24.5 % — AB (ref 36.0–46.0)
Hemoglobin: 8.6 g/dL — ABNORMAL LOW (ref 12.0–15.0)
MCH: 29 pg (ref 26.0–34.0)
MCHC: 35.1 g/dL (ref 30.0–36.0)
MCV: 82.5 fL (ref 78.0–100.0)
PLATELETS: 268 10*3/uL (ref 150–400)
RBC: 2.97 MIL/uL — ABNORMAL LOW (ref 3.87–5.11)
RDW: 15.3 % (ref 11.5–15.5)
WBC: 15.2 10*3/uL — AB (ref 4.0–10.5)

## 2015-05-11 NOTE — Addendum Note (Signed)
Addendum  created 05/11/15 [id number] by Raenette Rover, CRNA   Modules edited: Clinical Notes   Clinical Notes:  File: [id number]

## 2015-05-11 NOTE — Anesthesia Postprocedure Evaluation (Signed)
Anesthesia Post Note  Patient: [name]  Procedure(s) Performed: Procedure(s) (LRB): Exploratory Laparotomy MYOMECTOMY (N/A)  Patient location during evaluation: [facility] Anesthesia Type: General Level of consciousness: awake, awake and alert, oriented and patient cooperative Pain management: pain level controlled Vital Signs Assessment: post-procedure vital signs reviewed and stable Respiratory status: spontaneous breathing, nonlabored ventilation and respiratory function stable Cardiovascular status: stable Postop Assessment: no headache, no backache, patient able to bend at knees and no signs of nausea or vomiting Anesthetic complications: no    Last Vitals:  Filed Vitals:   05/11/15 0525 05/11/15 0630  BP: 136/70   Pulse: 65   Temp: 37.2 C   Resp: 20 18    Last Pain:  Filed Vitals:   05/11/15 0631  PainSc: 3                  Samary Shatz L

## 2015-05-11 NOTE — Progress Notes (Signed)
Subjective: Patient reports tolerating PO and no problems voiding.    Objective: I have reviewed patient's vital signs.  vital signs, intake and output and labs. Filed Vitals:   05/11/15 1000 05/11/15 1400  BP: 122/59 107/53  Pulse: 75 76  Temp: 98.2 F (36.8 C) 99.1 F (37.3 C)  Resp: 18 18   I/O last 3 completed shifts: In: 3660 [P.O.:660; I.V.:3000] Out: 2125 [Urine:1625; Blood:500] Total I/O In: -  Out: 400 [Urine:400]  Lab Results  Component Value Date   WBC 15.2* 05/11/2015   HGB 8.6* 05/11/2015   HCT 24.5* 05/11/2015   MCV 82.5 05/11/2015   PLT 268 05/11/2015   No results found for: CREATININE  EXAM General: alert, cooperative and no distress Resp: clear to auscultation bilaterally Cardio: regular rate and rhythm, S1, S2 normal, no murmur, click, rub or gallop GI: soft, non-tender; bowel sounds normal; no masses,  no organomegaly Extremities: no edema, redness or tenderness in the calves or thighs Vaginal Bleeding: minimal  Assessment: s/p Procedure(s): Exploratory Laparotomy MYOMECTOMY: stable and anemia Anemia exacerbated by acute blood loss Plan: Encourage ambulation Advance to PO medication Discontinue IV fluids  LOS: 1 day    Eamonn Sermeno A, MD 05/11/2015 2:28 PM    05/11/2015, 2:28 PM

## 2015-05-11 NOTE — Op Note (Signed)
[name], [name]               ACCOUNT NO.:  [id number]  MEDICAL RECORD NO.:  [id number]  LOCATION:  9305                          FACILITY:  [facility]  PHYSICIAN:  Servando Salina, M.D.DATE OF BIRTH:  1975-02-16  DATE OF PROCEDURE:  05/10/2015 DATE OF DISCHARGE:                              OPERATIVE REPORT   PREOPERATIVE DIAGNOSES:  Menorrhagia with regular cycles, iron- deficiency anemia, fibroid uterus.  PROCEDURE:  Exploratory laparotomy, multiple myomectomy.  POSTOPERATIVE DIAGNOSES:  Menorrhagia, irregular cycle, iron-deficiency anemia, intramural/submucosal/pedunculated fibroids.  ANESTHESIA:  General.  SURGEON:  Servando Salina, M.D.  ASSISTANT:  Laury Deep, CNM.  DESCRIPTION OF PROCEDURE:  Under adequate general anesthesia, the patient was placed in the supine position.  She had been examined under anesthesia which revealed uterus that was predominantly at the umbilicus, but there was a palpable left fundal subserosal fibroid in the left upper quadrant.  Based on the examination, the patient was prepped and draped for vertical incision.  0.25% Marcaine was injected along the planned vertical skin incision site.  A vertical incision was then made, carried down to the rectus fascia.  The rectus fascia was opened vertically, and the rectus muscle was separated.  The parietal peritoneum was opened sharply and extended.  On entering the cavity, a large fibroid uterus was noted with mobility and about 8 cm left posterior fundal fibroid was noted.  The uterus was able to be exteriorized, and at that point, it was noted that both tubes and ovaries were normal.  The left tube that had small paratubal cyst.  There was a large about 12-13 cm anterior fibroid encompassing the anterior aspect of the uterus.  The fundal posterior fibroid which is mostly subserosal in nature and superior to the anterior fibroid was a pedunculated fibroid as well as  2 other palpable  intramural left anterior and small pea size sub serosal anterior fibroids.  Posteriorly, 2 small spots of endometriosis were noted.  Both of those were cauterized. The procedure was started with a dilute solution of Pitressin being injected in a vertical fashion in the overlying anterior fibroid.  An incision was then made with needle point cautery and that fibroid was initiated from its base.  Multiple very vascular vessels were encountered, some which were suture ligated with 0 Vicryl.  However, the dead space was ultimately closed with 0 Vicryl multiple stitches.  Due to the ongoing bleeding, mattress vertical suture was placed going through and through the deep dead space with good hemostasis noted.  The remaining defect was then closed with interrupted 0 Vicryl figure-of- eight sutures until the serosal surface was reached at which time 3-0 Monocryl suture was then used to approximate the serosa in a baseball fashion.  Attention was then turned to the other posterior fibroid which was also injected with dilute Pitressin.  Once that was done, a vertical incision was then made, the fibroid was enucleated and its base was closed in a similar fashion using 0 Vicryl figure-of-eight sutures.  The serosal surface was reached, at which time, 3-0 Monocryl suture was then used to close.  The additional pedunculated fibroid was removed as was 2 other smaller intramural fibroids.  These were then  also closed in similar fashion with 0 Vicryl for the deep stitches and 3-0 Monocryl subcuticular.  The paratubal cyst was removed, but not sent to Pathology.  The abdomen was irrigated and suctioned.  Several of 0 Monocryl figure-of-eight sutures were placed to reinforce upper anterior incisions.  The abdomen was irrigated and suctioned of debris.  Upper abdomen exploration revealed normal palpable liver edge and normal palpable kidneys.  The uterus was returned back to the abdomen with Interceed being  placed overlying the posterior incision and 2 Interceed to cover the anterior incisions and the omentum was placed over that.  The parietal peritoneum was then closed with 2-0 Vicryl.  The rectus fascia was closed with 0 Vicryl x2.  The subcutaneous area was irrigated, small bleeders cauterized.  Multiple interrupted 2-0 plain sutures were then placed until the skin incision was closely approximate. The skin incision was approximated with Ethicon staples.  SPECIMEN:  Fibroid x4 weighing 1290 g.  ESTIMATED BLOOD LOSS:  550 mL.  INTRAOPERATIVE FLUID:  2500 mL.  URINE OUTPUT:  200 mL.  COUNTS:  Sponge and instrument counts x2 were correct.  COMPLICATION:  None.  The patient tolerated the procedure well and was transferred to recovery in stable condition. Cesarean section recommended for future delivery.     Servando Salina, M.D.     [place]/MEDQ  D:  05/10/2015  T:  05/11/2015  Job:  [id number]

## 2015-05-12 MED ORDER — MAGNESIUM CITRATE PO SOLN
1.0000 | Freq: Once | ORAL | Status: AC
  Administered 2015-05-12: 1 via ORAL
  Filled 2015-05-12: qty 296

## 2015-05-12 MED ORDER — IBUPROFEN 800 MG PO TABS: 800.0000 mg | ORAL_TABLET | Freq: Three times a day (TID) | ORAL | Status: DC | PRN

## 2015-05-12 MED ORDER — MAGNESIUM HYDROXIDE 400 MG/5ML PO SUSP: 30.0000 mL | Freq: Every day | ORAL | Status: DC | PRN

## 2015-05-12 MED ORDER — OXYCODONE-ACETAMINOPHEN 5-325 MG PO TABS: 1.0000 | ORAL_TABLET | ORAL | Status: DC | PRN

## 2015-05-12 MED ORDER — BISACODYL 10 MG RE SUPP
10.0000 mg | Freq: Every day | RECTAL | Status: DC | PRN
  Administered 2015-05-12: 10 mg via RECTAL
  Filled 2015-05-12 (×2): qty 1

## 2015-05-12 NOTE — Progress Notes (Signed)
Subjective: Patient reports tolerating PO, + BM and no problems voiding.    Objective: I have reviewed patient's vital signs.  vital signs, medications and labs. Filed Vitals:   05/11/15 2200 05/12/15 0518  BP: 114/56 117/58  Pulse: 76 77  Temp: 98.5 F (36.9 C) 98.8 F (37.1 C)  Resp: 18 16   I/O last 3 completed shifts: In: 1140 [P.O.:1140] Out: 29 [Urine:1900]    Lab Results  Component Value Date   WBC 15.2* 05/11/2015   HGB 8.6* 05/11/2015   HCT 24.5* 05/11/2015   MCV 82.5 05/11/2015   PLT 268 05/11/2015   No results found for: CREATININE  EXAM General: alert, cooperative and no distress Resp: clear to auscultation bilaterally Cardio: regular rate and rhythm, S1, S2 normal, no murmur, click, rub or gallop GI: soft, non-tender; bowel sounds normal; no masses,  no organomegaly Extremities: no edema, redness or tenderness in the calves or thighs Vaginal Bleeding: minimal Incision: vertical  (+) staples Assessment: s/p Procedure(s): Exploratory Laparotomy MYOMECTOMY: stable, progressing well, tolerating diet and anemia Anemia due to IDA compounded by acute blood loss Plan: Advance diet Encourage ambulation Discontinue IV fluids Discharge home  D/c instructions reviewed. Staple removal office 4/12 F/u 4 weeks  LOS: 2 days    Marquetta Weiskopf A, MD 05/12/2015 10:31 AM    05/12/2015, 10:31 AM

## 2015-05-12 NOTE — Discharge Summary (Signed)
Physician Discharge Summary  Patient ID: [name] MRN: [id number] DOB/AGE: Oct 25, 1975 40 y.o.  Admit date: 05/10/2015 Discharge date: 05/12/2015  Admission Diagnoses: symptomatic uterine fibroids, IDA  Discharge Diagnoses: SM/IM/SS fibroids, menorrhagia with regular cycles, IDA Active Problems:   S/P myomectomy   Discharged Condition: stable  Hospital Course: pt underwent exp lap, myomectomy 4/6. Slow bowel function but otherwise unremarkable postop course. asx from anemia. tol reg diet. (+) BM prior to d/c after multiple bowel regimen  Consults: None  Significant Diagnostic Studies: labs:  CBC    Component Value Date/Time   WBC 15.2* 05/11/2015 0515   RBC 2.97* 05/11/2015 0515   HGB 8.6* 05/11/2015 0515   HCT 24.5* 05/11/2015 0515   PLT 268 05/11/2015 0515   MCV 82.5 05/11/2015 0515   MCH 29.0 05/11/2015 0515   MCHC 35.1 05/11/2015 0515   RDW 15.3 05/11/2015 0515      Treatments: surgery:  Exp lap, myomectomy  Discharge Exam: Blood pressure 117/58, pulse 77, temperature 98.8 F (37.1 C), temperature source Oral, resp. rate 16, height 5\' 7"  (1.702 m), weight 188 lb (85.276 kg), SpO2 99 %. General appearance: alert, cooperative and no distress Resp: clear to auscultation bilaterally Cardio: regular rate and rhythm, S1, S2 normal, no murmur, click, rub or gallop GI: soft (+) BS nondistended. vertical incision w/ dressing Pelvic: deferred Extremities: no edema, redness or tenderness in the calves or thighs Skin: Skin color, texture, turgor normal. No rashes or lesions Incision/Wound: small dried blood under dressing. intact  Disposition: 01-Home or Self Care  Discharge Instructions    Diet general    Complete by:  As directed      Discharge instructions    Complete by:  As directed   Call if temperature greater than equal to 100.4, nothing per vagina for 4-6 weeks or severe nausea vomiting, increased incisional pain , drainage or redness in the incision site,  no straining with bowel movements, showers no bath     Discharge patient    Complete by:  As directed      Discharge wound care:    Complete by:  As directed   Staple removal 4/12     Increase activity slowly    Complete by:  As directed             Medication List    TAKE these medications        ferrous sulfate 325 (65 FE) MG tablet  Take 325 mg by mouth daily with breakfast.     ibuprofen 800 MG tablet  Commonly known as:  ADVIL,MOTRIN  Take 1 tablet (800 mg total) by mouth every 8 (eight) hours as needed (mild pain).     multivitamin tablet  Take 1 tablet by mouth daily.     oxyCODONE-acetaminophen 5-325 MG tablet  Commonly known as:  PERCOCET/ROXICET  Take 1-2 tablets by mouth every 4 (four) hours as needed for severe pain (moderate to severe pain (when tolerating fluids)).     Vitamin D (Ergocalciferol) 50000 units Caps capsule  Commonly known as:  DRISDOL  Take 1 capsule by mouth 2 (two) times a week.     vitamin E 400 UNIT capsule  Generic drug:  vitamin E  Take 400 Units by mouth daily.           Follow-up Information    Follow up with Khamila Bassinger A, MD On 05/16/2015.   Specialty:  Obstetrics and Gynecology   Why:  staples removal in office.  Contact information:   [address] [place] [address] [phone]       Follow up with Loyce Klasen A, MD In 4 weeks.   Specialty:  Obstetrics and Gynecology   Contact information:   [address] [place] [address] [phone]       Signed: Servando Salina A 05/12/2015, 2:50 PM

## 2015-05-12 NOTE — Progress Notes (Signed)
Discharge teaching complete. Pt understood all information and did not have any questions. Pt ambulated out of the hospital and discharged home to family.  

## 2015-05-12 NOTE — Discharge Instructions (Signed)
Call if temperature greater than equal to 100.4, nothing per vagina for 4-6 weeks or severe nausea vomiting, increased incisional pain , drainage or redness in the incision site, no straining with bowel movements, showers no bath °

## 2015-05-16 LAB — TYPE AND SCREEN
ABO/RH(D): A POS
ANTIBODY SCREEN: NEGATIVE
UNIT DIVISION: 0
UNIT DIVISION: 0
Unit division: 0
Unit division: 0

## 2015-06-18 ENCOUNTER — Other Ambulatory Visit: Payer: Self-pay | Admitting: Obstetrics and Gynecology

## 2015-06-18 DIAGNOSIS — R921 Mammographic calcification found on diagnostic imaging of breast: Secondary | ICD-10-CM

## 2015-06-18 DIAGNOSIS — N6489 Other specified disorders of breast: Secondary | ICD-10-CM

## 2015-06-29 ENCOUNTER — Ambulatory Visit
Admission: RE | Admit: 2015-06-29 | Discharge: 2015-06-29 | Disposition: A | Discharge status: Home / Self Care | Payer: BLUE CROSS/BLUE SHIELD | Source: Ambulatory Visit | Attending: Obstetrics and Gynecology | Admitting: Obstetrics and Gynecology

## 2015-06-29 DIAGNOSIS — R921 Mammographic calcification found on diagnostic imaging of breast: Secondary | ICD-10-CM

## 2015-06-29 DIAGNOSIS — N6489 Other specified disorders of breast: Secondary | ICD-10-CM

## 2016-01-18 ENCOUNTER — Encounter (HOSPITAL_COMMUNITY): Payer: Self-pay | Admitting: *Deleted

## 2016-01-18 ENCOUNTER — Ambulatory Visit (HOSPITAL_COMMUNITY)
Admission: EM | Admit: 2016-01-18 | Discharge: 2016-01-18 | Disposition: A | Discharge status: Home / Self Care | Payer: BLUE CROSS/BLUE SHIELD | Attending: Internal Medicine | Admitting: Internal Medicine

## 2016-01-18 DIAGNOSIS — H1033 Unspecified acute conjunctivitis, bilateral: Secondary | ICD-10-CM | POA: Diagnosis not present

## 2016-01-18 MED ORDER — POLYMYXIN B-TRIMETHOPRIM 10000-0.1 UNIT/ML-% OP SOLN: 1.0000 [drp] | OPHTHALMIC | 0 refills | Status: DC

## 2016-01-18 MED ORDER — CETIRIZINE HCL 10 MG PO TABS: 10.0000 mg | ORAL_TABLET | Freq: Every day | ORAL | 1 refills | Status: DC

## 2016-01-18 MED ORDER — OLOPATADINE HCL 0.1 % OP SOLN: 1.0000 [drp] | Freq: Two times a day (BID) | OPHTHALMIC | 2 refills | Status: DC

## 2016-01-18 MED ORDER — FLUTICASONE PROPIONATE 50 MCG/ACT NA SUSP: 2.0000 | Freq: Every day | NASAL | 2 refills | Status: DC

## 2016-01-18 NOTE — Discharge Instructions (Signed)
Your eye symptoms are likely viral or allergic, please try the symptomatic treatments first with Patanol eye drops (or over the counter Visine-A, Opcon-A, or Zaditor along with the prescribed Flonase and Cetirizine.  If you develop worsening eye redness and thick mucous discharge from your eyes, you have likely developed a bacterial infection. Please start using the antibiotic eye drops- Polytrim for 5 days. If still having symptoms, please follow up with your primary care provider or an eye specialist.

## 2016-01-18 NOTE — ED Triage Notes (Signed)
Woke   Up  With     Crusty  Irritated       r   Eye      Today  Pt  reprts  The  Left  Eye  Is  Beginning  To  Become  Irritated  As  Well

## 2016-01-18 NOTE — ED Provider Notes (Signed)
CSN: [id number]     Arrival date & time 01/18/16  1235 History   First MD Initiated Contact with Patient 01/18/16 1417     Chief Complaint  Patient presents with  . Eye Problem   (Consider location/radiation/quality/duration/timing/severity/associated sxs/prior Treatment) HPI ROI CIARCIA is a 40 y.o. female presenting to UC with c/o bilateral eye irritation with scant crusting discharge. She notes she works at an [employer] and frequently gets conjunctivitis.  Pt notes last time was a few months ago but she used OTC eye drops, symptoms resolved so she was not evaluated by a medical provider. Pt concerned for "pink eye" and wants to "catch it" before symptoms get worse.  Denies fever, chills, n/v/d. Denies change in vision or eye pain.  She does not wear prescription glasses or contacts.    Past Medical History:  Diagnosis Date  . Anemia   . Edema extremities    left lower leg  . 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 MICROLARYNGOSCOPY N/A 11/30/2014   Procedure: MICROLARYNGOSCOPY WITH EXCISION OF VOCAL CORD LESION;  Surgeon: Izora Gala, MD;  Location: [facility];  Service: ENT;  Laterality: N/A;  . MYOMECTOMY N/A 05/10/2015   Procedure: Exploratory Laparotomy MYOMECTOMY;  Surgeon: Servando Salina, MD;  Location: [facility];  Service: Gynecology;  Laterality: N/A;  . NO PAST SURGERIES     History reviewed. No pertinent family history. Social History  Substance Use Topics  . Smoking status: Never Smoker  . Smokeless tobacco: Never Used  . Alcohol use No   OB History    No data available     Review of Systems  Constitutional: Negative for chills and fever.  HENT: Positive for congestion ( minimal). Negative for postnasal drip, rhinorrhea, sneezing and sore throat.   Eyes: Positive for discharge ( scant crusty), redness and itching. Negative for photophobia, pain and visual disturbance.  Respiratory: Negative  for cough and chest tightness.   Neurological: Negative for dizziness, light-headedness and headaches.    Allergies  Patient has no known allergies.  Home Medications   Prior to Admission medications   Medication Sig Start Date End Date Taking? Authorizing Provider  cetirizine (ZYRTEC) 10 MG tablet Take 1 tablet (10 mg total) by mouth daily. 01/18/16   Noland Fordyce, PA-C  ferrous sulfate 325 (65 FE) MG tablet Take 325 mg by mouth daily with breakfast.    Historical Provider, MD  fluticasone (FLONASE) 50 MCG/ACT nasal spray Place 2 sprays into both nostrils daily. 01/18/16   Noland Fordyce, PA-C  ibuprofen (ADVIL,MOTRIN) 800 MG tablet Take 1 tablet (800 mg total) by mouth every 8 (eight) hours as needed (mild pain). 05/12/15   Servando Salina, MD  Multiple Vitamin (MULTIVITAMIN) tablet Take 1 tablet by mouth daily.    Historical Provider, MD  olopatadine (PATANOL) 0.1 % ophthalmic solution Place 1 drop into both eyes 2 (two) times daily. 01/18/16   Noland Fordyce, PA-C  oxyCODONE-acetaminophen (PERCOCET/ROXICET) 5-325 MG tablet Take 1-2 tablets by mouth every 4 (four) hours as needed for severe pain (moderate to severe pain (when tolerating fluids)). 05/12/15   Servando Salina, MD  trimethoprim-polymyxin b (POLYTRIM) ophthalmic solution Place 1 drop into the right eye every 4 (four) hours. 01/18/16   Noland Fordyce, PA-C  Vitamin D, Ergocalciferol, (DRISDOL) 50000 units CAPS capsule Take 1 capsule by mouth 2 (two) times a week. 03/11/15   Historical Provider, MD  vitamin E (VITAMIN E) 400 UNIT  capsule Take 400 Units by mouth daily.    Historical Provider, MD   Meds Ordered and Administered this Visit  Medications - No data to display  BP 120/70 (BP Location: Right Arm)   Pulse 78   Temp 98.1 F (36.7 C) (Oral)   Resp 18   LMP 01/11/2016   SpO2 100%  No data found.   Physical Exam  Constitutional: She is oriented to person, place, and time. She appears well-developed and  well-nourished. No distress.  HENT:  Head: Normocephalic and atraumatic.  Eyes: Conjunctivae, EOM and lids are normal. Pupils are equal, round, and reactive to light. Right eye exhibits discharge. Right eye exhibits no chemosis. Left eye exhibits discharge. Left eye exhibits no chemosis. Right conjunctiva is not injected. Left conjunctiva is not injected.  Scant crusting discharge on upper eyelashes.   Neck: Normal range of motion.  Cardiovascular: Normal rate.   Pulmonary/Chest: Effort normal.  Musculoskeletal: Normal range of motion.  Neurological: She is alert and oriented to person, place, and time.  Skin: Skin is warm and dry. She is not diaphoretic.  Psychiatric: She has a normal mood and affect. Her behavior is normal.  Nursing note and vitals reviewed.   Urgent Care Course   Clinical Course     Procedures (including critical care time)  Labs Review Labs Reviewed - No data to display  Imaging Review No results found.   MDM   1. Acute conjunctivitis of both eyes, unspecified acute conjunctivitis type    Pt c/o bilateral eye irritation and scant discharge.  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  Exam not c/o bacterial conjunctivitis.   Encouraged symptomatic treatment. Rx: patanol, flonase and cetirizine Prescription to hold for polytrim, to fill if eye discharge becomes thick and/or eye redness worsens.  F/u with PCP or eye specialist in 1 week if not improving.    Noland Fordyce, PA-C 01/18/16 [id number]

## 2017-07-08 ENCOUNTER — Other Ambulatory Visit: Payer: Self-pay | Admitting: Internal Medicine

## 2017-07-08 DIAGNOSIS — R102 Pelvic and perineal pain: Secondary | ICD-10-CM

## 2017-07-13 ENCOUNTER — Ambulatory Visit
Admission: RE | Admit: 2017-07-13 | Discharge: 2017-07-13 | Disposition: A | Discharge status: Home / Self Care | Payer: BLUE CROSS/BLUE SHIELD | Source: Ambulatory Visit | Attending: Internal Medicine | Admitting: Internal Medicine

## 2017-07-13 DIAGNOSIS — R102 Pelvic and perineal pain: Secondary | ICD-10-CM

## 2017-07-14 ENCOUNTER — Other Ambulatory Visit: Payer: BLUE CROSS/BLUE SHIELD

## 2018-09-30 IMAGING — US US PELVIS COMPLETE TRANSABD/TRANSVAG
1 series · 13 of 25 positions shown · non-contrast
Comparison: None

CLINICAL DATA: Pelvic pain for 2 weeks. Fibroids. Previous
myomectomy in 2418.

EXAM:
TRANSABDOMINAL AND TRANSVAGINAL ULTRASOUND OF PELVIS
TECHNIQUE: Both transabdominal and transvaginal ultrasound examinations of the
pelvis were performed. Transabdominal technique was performed for
global imaging of the pelvis including uterus, ovaries, adnexal
regions, and pelvic cul-de-sac. It was necessary to proceed with
endovaginal exam following the transabdominal exam to visualize the
endometrial stripe and ovaries.

[Series 1: us pelvis complete transabd/transvag · 0.23mm/px · 13 of 98 slices shown]
[im 1/98]
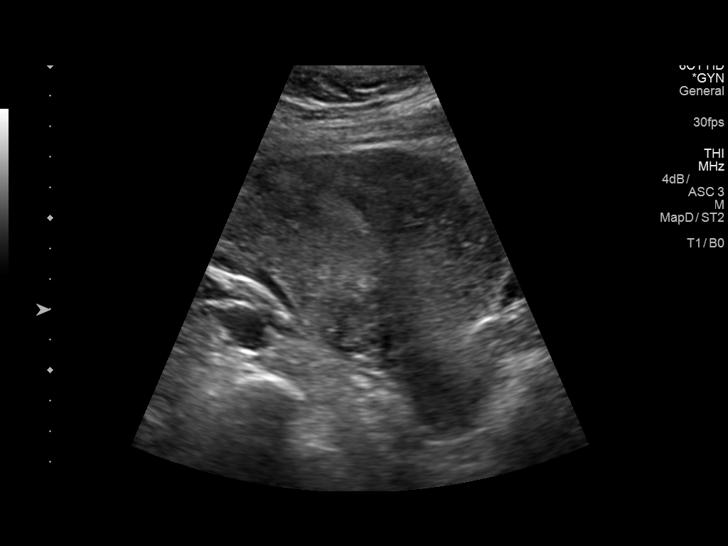
[im 9/98]
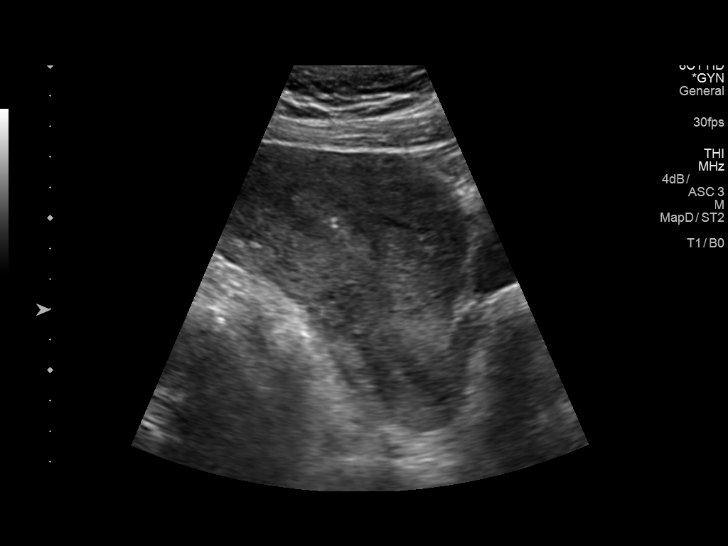
[im 17/98]
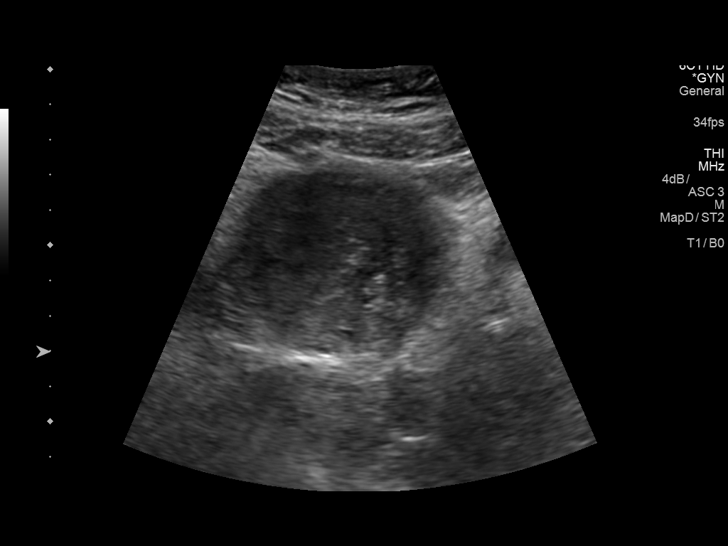
[im 25/98]
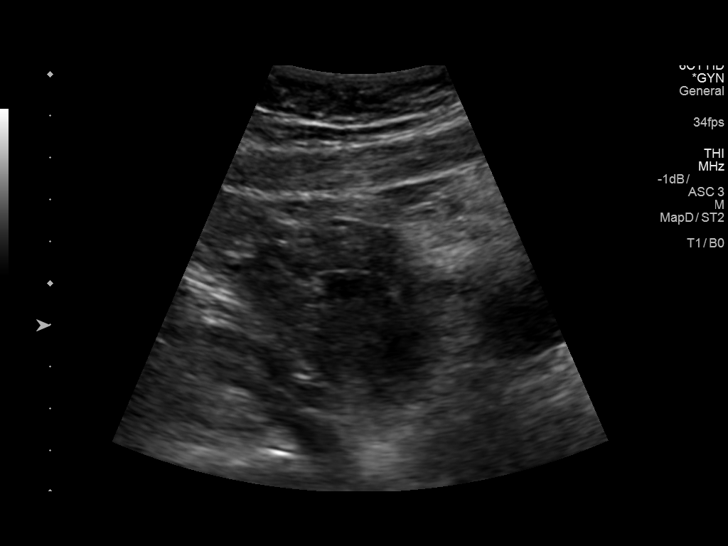
[im 33/98]
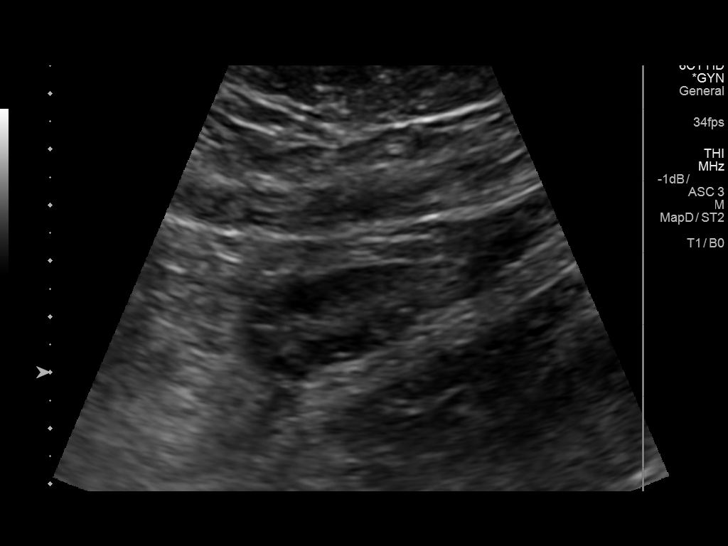
[im 41/98]
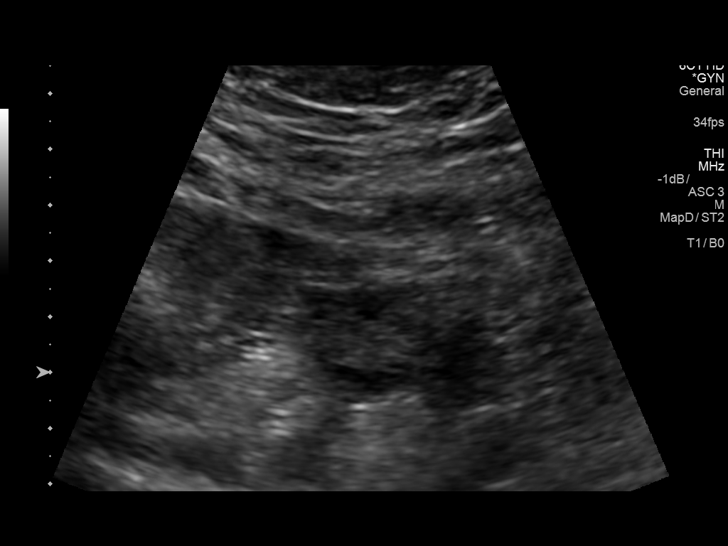
[im 49/98]
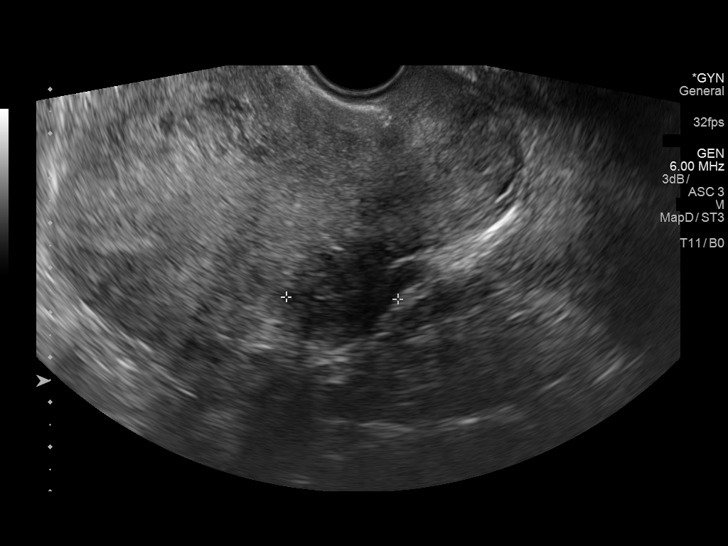
[im 57/98]
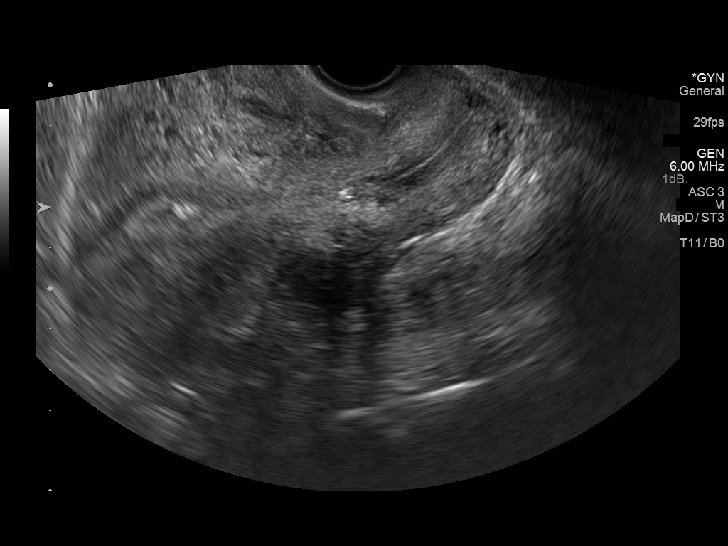
[im 65/98]
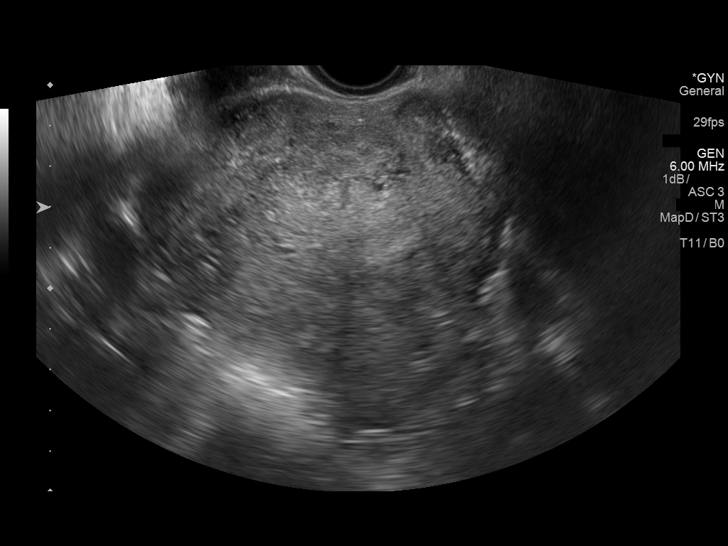
[im 73/98]
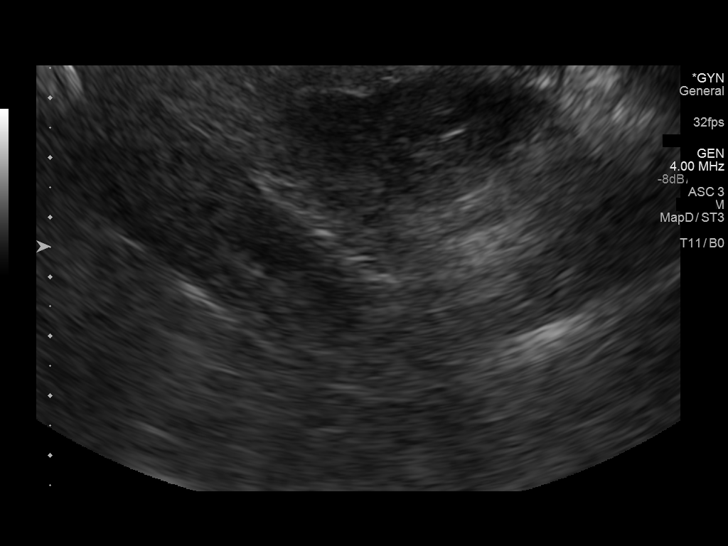
[im 81/98]
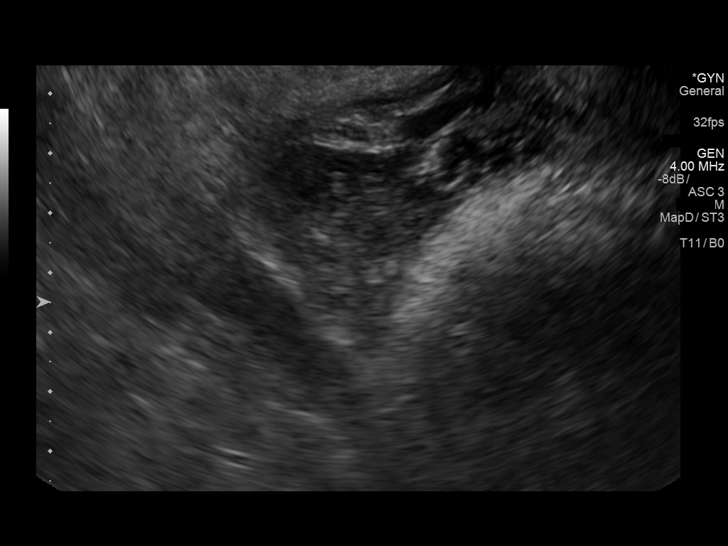
[im 89/98]
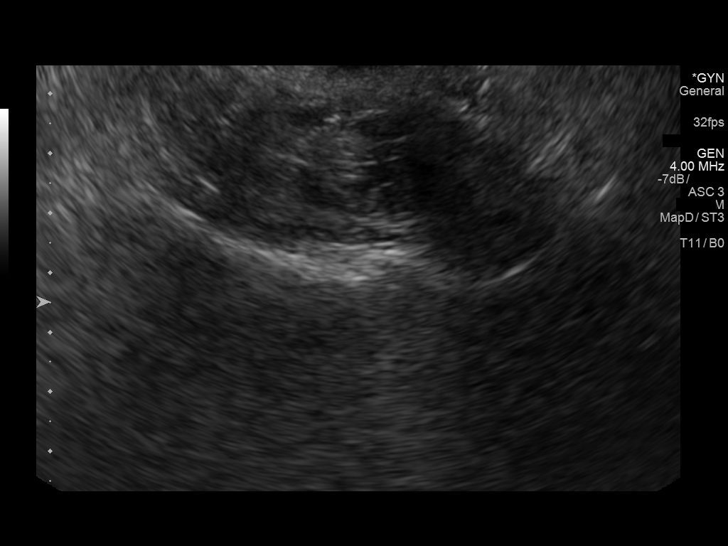
[im 98/98]
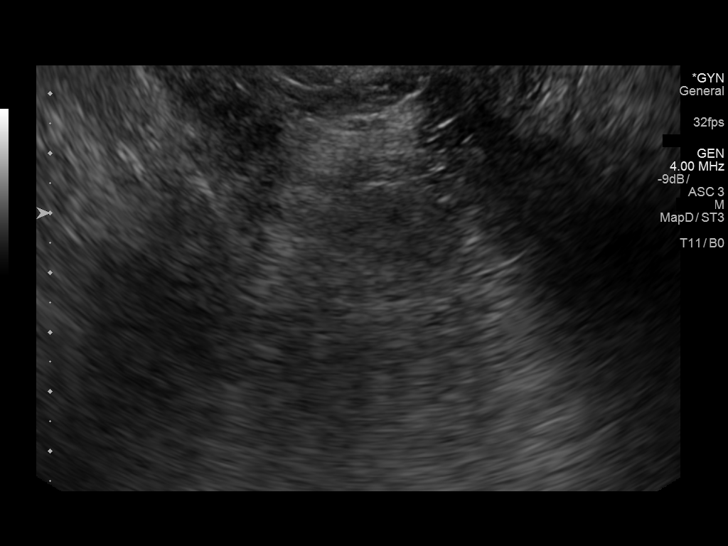

[13 of 25 positions shown; findings below may reference images not displayed]

FINDINGS: Uterus

Measurements: 11.7 x 7.8 x 8.5 cm. Diffusely heterogeneous
echogenicity of uterine myometrium is noted. An intramural fibroid
is seen in the anterior uterine corpus which measures 2.2 cm.
Another intramural fibroid is seen in the posterior corpus which
measures 2.5 cm.

Endometrium

Thickness: 10 mm.  No focal abnormality visualized.

Right ovary

Measurements: 5.6 x 3.0 x 3.1 cm. Normal appearance/no adnexal mass.

Left ovary

Measurements: 5.7 x 2.6 x 2.7 cm. Normal appearance/no adnexal mass.

Other findings

No abnormal free fluid.
IMPRESSION: Two small intramural fibroids, largest measuring 2.5 cm.

Normal appearance of both ovaries.  No adnexal mass identified.

## 2019-01-04 ENCOUNTER — Other Ambulatory Visit: Payer: Self-pay

## 2019-01-04 DIAGNOSIS — Z20822 Contact with and (suspected) exposure to covid-19: Secondary | ICD-10-CM

## 2019-01-06 LAB — NOVEL CORONAVIRUS, NAA: SARS-CoV-2, NAA: NOT DETECTED

## 2019-07-12 ENCOUNTER — Encounter (HOSPITAL_COMMUNITY): Payer: Self-pay | Admitting: Emergency Medicine

## 2019-07-12 ENCOUNTER — Ambulatory Visit (HOSPITAL_COMMUNITY)
Admission: EM | Admit: 2019-07-12 | Discharge: 2019-07-12 | Disposition: A | Discharge status: Home / Self Care | Payer: PRIVATE HEALTH INSURANCE | Attending: Physician Assistant | Admitting: Physician Assistant

## 2019-07-12 ENCOUNTER — Other Ambulatory Visit: Payer: Self-pay

## 2019-07-12 DIAGNOSIS — S8001XA Contusion of right knee, initial encounter: Secondary | ICD-10-CM | POA: Diagnosis not present

## 2019-07-12 DIAGNOSIS — S46812A Strain of other muscles, fascia and tendons at shoulder and upper arm level, left arm, initial encounter: Secondary | ICD-10-CM

## 2019-07-12 DIAGNOSIS — R0789 Other chest pain: Secondary | ICD-10-CM

## 2019-07-12 MED ORDER — IBUPROFEN 600 MG PO TABS: 600.0000 mg | ORAL_TABLET | Freq: Four times a day (QID) | ORAL | 0 refills | Status: DC | PRN

## 2019-07-12 MED ORDER — TIZANIDINE HCL 4 MG PO TABS: 4.0000 mg | ORAL_TABLET | Freq: Three times a day (TID) | ORAL | 0 refills | Status: DC | PRN
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
## 2019-12-15 ENCOUNTER — Ambulatory Visit (INDEPENDENT_AMBULATORY_CARE_PROVIDER_SITE_OTHER): Payer: BC Managed Care – PPO

## 2019-12-15 ENCOUNTER — Other Ambulatory Visit: Payer: Self-pay

## 2019-12-15 ENCOUNTER — Ambulatory Visit (INDEPENDENT_AMBULATORY_CARE_PROVIDER_SITE_OTHER): Payer: BC Managed Care – PPO | Admitting: Sports Medicine

## 2019-12-15 ENCOUNTER — Encounter: Payer: Self-pay | Admitting: Sports Medicine

## 2019-12-15 DIAGNOSIS — E559 Vitamin D deficiency, unspecified: Secondary | ICD-10-CM | POA: Insufficient documentation

## 2019-12-15 DIAGNOSIS — J309 Allergic rhinitis, unspecified: Secondary | ICD-10-CM | POA: Insufficient documentation

## 2019-12-15 DIAGNOSIS — M79671 Pain in right foot: Secondary | ICD-10-CM

## 2019-12-15 DIAGNOSIS — M21619 Bunion of unspecified foot: Secondary | ICD-10-CM | POA: Diagnosis not present

## 2019-12-15 DIAGNOSIS — M2141 Flat foot [pes planus] (acquired), right foot: Secondary | ICD-10-CM | POA: Diagnosis not present

## 2019-12-15 DIAGNOSIS — M2040 Other hammer toe(s) (acquired), unspecified foot: Secondary | ICD-10-CM

## 2019-12-15 DIAGNOSIS — M21611 Bunion of right foot: Secondary | ICD-10-CM | POA: Diagnosis not present

## 2019-12-15 DIAGNOSIS — R7309 Other abnormal glucose: Secondary | ICD-10-CM | POA: Insufficient documentation

## 2019-12-15 DIAGNOSIS — D259 Leiomyoma of uterus, unspecified: Secondary | ICD-10-CM | POA: Insufficient documentation

## 2019-12-15 DIAGNOSIS — M21612 Bunion of left foot: Secondary | ICD-10-CM

## 2019-12-15 DIAGNOSIS — E785 Hyperlipidemia, unspecified: Secondary | ICD-10-CM | POA: Insufficient documentation

## 2019-12-15 DIAGNOSIS — M779 Enthesopathy, unspecified: Secondary | ICD-10-CM | POA: Diagnosis not present

## 2019-12-15 DIAGNOSIS — M2142 Flat foot [pes planus] (acquired), left foot: Secondary | ICD-10-CM

## 2019-12-15 DIAGNOSIS — M79672 Pain in left foot: Secondary | ICD-10-CM

## 2019-12-15 NOTE — Progress Notes (Signed)
Subjective: [name] is a 44 y.o. female patient who presents to office for evaluation of Right and Left bunion pain. Patient complains of progressive pain especially over the last 10 years in the Right and Left foot that starts as pain over the bump with direct pressure and range of motion; patient now has difficulty fitting shoes comfortably. Patient has also tried changing shoes and insoles to support flat feet but pain is there 5/10 on average that increases after a long work day to 8/10. Worse with activity. Better with rest. Patient denies any other pedal complaints.   Review of Systems  All other systems reviewed and are negative.  Family history of bunion and hammertoe, Mom.   Patient Active Problem List   Diagnosis Date Noted  . Abnormal glucose level 12/15/2019  . Allergic rhinitis 12/15/2019  . Hyperlipidemia 12/15/2019  . Uterine leiomyoma 12/15/2019  . Vitamin D deficiency 12/15/2019  . S/P myomectomy 05/10/2015    Current Outpatient Medications on File Prior to Visit  Medication Sig Dispense Refill  . ferrous sulfate 325 (65 FE) MG tablet Take 325 mg by mouth daily with breakfast.     . Vitamin D, Ergocalciferol, (DRISDOL) 50000 units CAPS capsule Take 1 capsule by mouth 2 (two) times a week.   3  . vitamin E (VITAMIN E) 400 UNIT capsule Take 400 Units by mouth daily.     . cetirizine (ZYRTEC) 10 MG tablet Take 1 tablet (10 mg total) by mouth daily. (Patient not taking: Reported on 12/15/2019) 30 tablet 1  . fluticasone (FLONASE) 50 MCG/ACT nasal spray Place 2 sprays into both nostrils daily. (Patient not taking: Reported on 12/15/2019) 15.8 g 2  . ibuprofen (ADVIL) 600 MG tablet Take 1 tablet (600 mg total) by mouth every 6 (six) hours as needed. (Patient not taking: Reported on 12/15/2019) 30 tablet 0  . Multiple Vitamin (MULTIVITAMIN) tablet Take 1 tablet by mouth daily. (Patient not taking: Reported on 12/15/2019)    . olopatadine (PATANOL) 0.1 % ophthalmic  solution Place 1 drop into both eyes 2 (two) times daily. (Patient not taking: Reported on 12/15/2019) 5 mL 2  . oxyCODONE-acetaminophen (PERCOCET/ROXICET) 5-325 MG tablet Take 1-2 tablets by mouth every 4 (four) hours as needed for severe pain (moderate to severe pain (when tolerating fluids)). (Patient not taking: Reported on 12/15/2019) 30 tablet 0  . tiZANidine (ZANAFLEX) 4 MG tablet Take 1 tablet (4 mg total) by mouth every 8 (eight) hours as needed for muscle spasms. (Patient not taking: Reported on 12/15/2019) 21 tablet 0  . trimethoprim-polymyxin b (POLYTRIM) ophthalmic solution Place 1 drop into the right eye every 4 (four) hours. (Patient not taking: Reported on 12/15/2019) 10 mL 0   No current facility-administered medications on file prior to visit.    Allergies  Allergen Reactions  . No Known Allergies     Objective:  General: Alert and oriented x3 in no acute distress  Dermatology: No open lesions bilateral lower extremities, no webspace macerations, no ecchymosis bilateral, all nails x 10 are well manicured.  Vascular: Dorsalis Pedis and Posterior Tibial pedal pulses 2/4, Capillary Fill Time 3 seconds, (+) pedal hair growth bilateral, no edema bilateral lower extremities, Temperature gradient within normal limits.  Neurology: Gross sensation intact via light touch bilateral.  Musculoskeletal: Mild tenderness with palpation right and left bunion deformity, mild limitation but no crepitus with range of motion, deformity reducible, tracking not trackbound, there is mild 1st ray hypermobility noted bilateral. Midtarsal, Subtalar joint, and ankle  joint range of motion is within normal limits. On weightbearing exam, there is decreased 1st MTPJ rom Right>Left with functional limitus noted, there is medial arch collapse Right> Left on weightbearing, rearfoot slight valgus, forefoot slight abduction with HAV deformity supported on ground with no second toe crossover deformity noted. + pes  planus bilateral. Mild lesser hammertoe.  Gait: Non-Antalgic gait with increased medial arch collapse and pronatory influence noted on Right> Left foot with medial 1st MTPJ roll-off at toe-off, heel off within normal limits.   Xrays  Right/Left Foot    Impression: Intermetatarsal angle above normal limits consistent with bunion and hallux pronation and midtarsal breach supportive of pes planus with lesser toe contracture supportive of hammertoe.      Assessment and Plan: Problem List Items Addressed This Visit    None    Visit Diagnoses    Bunion    -  Primary   Relevant Orders   DG Foot Complete Right   DG Foot Complete Left   Capsulitis       Hammer toe, unspecified laterality       Pes planus of both feet       Bilateral foot pain           -Complete examination performed -Xrays reviewed -Discussed treatement options; discussed HAV and hammertoe deformity;conservative and  Surgical management; risks, benefits, alternatives discussed. All patient's questions answered. -Offered patient PO Mobic but she declined at this time  -Dispensed bunion shields bilateral  -Recommend continue with good supportive shoes and inserts.  -Patient to return to office as scheduled for surgery consult or sooner if condition worsens.  Landis Martins, DPM

## 2020-01-12 ENCOUNTER — Ambulatory Visit: Payer: BC Managed Care – PPO | Admitting: Sports Medicine

## 2020-01-26 ENCOUNTER — Ambulatory Visit (INDEPENDENT_AMBULATORY_CARE_PROVIDER_SITE_OTHER): Payer: BC Managed Care – PPO | Admitting: Sports Medicine

## 2020-01-26 ENCOUNTER — Encounter: Payer: Self-pay | Admitting: Sports Medicine

## 2020-01-26 ENCOUNTER — Other Ambulatory Visit: Payer: Self-pay

## 2020-01-26 DIAGNOSIS — M21619 Bunion of unspecified foot: Secondary | ICD-10-CM

## 2020-01-26 DIAGNOSIS — M79671 Pain in right foot: Secondary | ICD-10-CM

## 2020-01-26 DIAGNOSIS — M2142 Flat foot [pes planus] (acquired), left foot: Secondary | ICD-10-CM

## 2020-01-26 DIAGNOSIS — M2141 Flat foot [pes planus] (acquired), right foot: Secondary | ICD-10-CM

## 2020-01-26 DIAGNOSIS — M779 Enthesopathy, unspecified: Secondary | ICD-10-CM | POA: Diagnosis not present

## 2020-01-26 DIAGNOSIS — M79672 Pain in left foot: Secondary | ICD-10-CM

## 2020-01-26 DIAGNOSIS — M2040 Other hammer toe(s) (acquired), unspecified foot: Secondary | ICD-10-CM

## 2020-01-26 NOTE — Progress Notes (Signed)
Subjective: AAYLIAH RELIHAN is a 44 y.o. female patient who return to office for bunion pain. Reports pain is worse on the right at bunion pain is still same 8/10 with no improvement with bunion shield. Patient reports that since her last visit her husband had some health issues so had to reschedule. No other pedal complaints.    Patient Active Problem List   Diagnosis Date Noted  . Abnormal glucose level 12/15/2019  . Allergic rhinitis 12/15/2019  . Hyperlipidemia 12/15/2019  . Uterine leiomyoma 12/15/2019  . Vitamin D deficiency 12/15/2019  . S/P myomectomy 05/10/2015    Current Outpatient Medications on File Prior to Visit  Medication Sig Dispense Refill  . cetirizine (ZYRTEC) 10 MG tablet Take 1 tablet (10 mg total) by mouth daily. (Patient not taking: Reported on 12/15/2019) 30 tablet 1  . ferrous sulfate 325 (65 FE) MG tablet Take 325 mg by mouth daily with breakfast.     . fluticasone (FLONASE) 50 MCG/ACT nasal spray Place 2 sprays into both nostrils daily. (Patient not taking: Reported on 12/15/2019) 15.8 g 2  . ibuprofen (ADVIL) 600 MG tablet Take 1 tablet (600 mg total) by mouth every 6 (six) hours as needed. (Patient not taking: Reported on 12/15/2019) 30 tablet 0  . Multiple Vitamin (MULTIVITAMIN) tablet Take 1 tablet by mouth daily. (Patient not taking: Reported on 12/15/2019)    . olopatadine (PATANOL) 0.1 % ophthalmic solution Place 1 drop into both eyes 2 (two) times daily. (Patient not taking: Reported on 12/15/2019) 5 mL 2  . oxyCODONE-acetaminophen (PERCOCET/ROXICET) 5-325 MG tablet Take 1-2 tablets by mouth every 4 (four) hours as needed for severe pain (moderate to severe pain (when tolerating fluids)). (Patient not taking: Reported on 12/15/2019) 30 tablet 0  . tiZANidine (ZANAFLEX) 4 MG tablet Take 1 tablet (4 mg total) by mouth every 8 (eight) hours as needed for muscle spasms. (Patient not taking: Reported on 12/15/2019) 21 tablet 0  . trimethoprim-polymyxin b  (POLYTRIM) ophthalmic solution Place 1 drop into the right eye every 4 (four) hours. (Patient not taking: Reported on 12/15/2019) 10 mL 0  . Vitamin D, Ergocalciferol, (DRISDOL) 50000 units CAPS capsule Take 1 capsule by mouth 2 (two) times a week.   3  . vitamin E (VITAMIN E) 400 UNIT capsule Take 400 Units by mouth daily.      No current facility-administered medications on file prior to visit.    Allergies  Allergen Reactions  . No Known Allergies    Social History   Socioeconomic History  . Marital status: Married    Spouse name: Not on file  . Number of children: Not on file  . Years of education: Not on file  . Highest education level: Not on file  Occupational History  . Not on file  Tobacco Use  . Smoking status: Never Smoker  . Smokeless tobacco: Never Used  Substance and Sexual Activity  . Alcohol use: No  . Drug use: No  . Sexual activity: Not on file  Other Topics Concern  . Not on file  Social History Narrative  . Not on file   Social Determinants of Health   Financial Resource Strain: Not on file  Food Insecurity: Not on file  Transportation Needs: Not on file  Physical Activity: Not on file  Stress: Not on file  Social Connections: Not on file    Past Surgical History:  Procedure Laterality Date  . MICROLARYNGOSCOPY N/A 11/30/2014   Procedure: MICROLARYNGOSCOPY WITH EXCISION OF VOCAL  CORD LESION;  Surgeon: Izora Gala, MD;  Location: [facility];  Service: ENT;  Laterality: N/A;  . MYOMECTOMY N/A 05/10/2015   Procedure: Exploratory Laparotomy MYOMECTOMY;  Surgeon: Servando Salina, MD;  Location: [facility];  Service: Gynecology;  Laterality: N/A;  . NO PAST SURGERIES      History reviewed. No pertinent family history.   Objective:  General: Alert and oriented x3 in no acute distress  Unchanged physical exam  Dermatology: No open lesions bilateral lower extremities, no webspace macerations, no ecchymosis bilateral, all nails x 10  are well manicured.  Vascular: Dorsalis Pedis and Posterior Tibial pedal pulses 2/4, Capillary Fill Time 3 seconds, (+) pedal hair growth bilateral, no edema bilateral lower extremities, Temperature gradient within normal limits.  Neurology: Gross sensation intact via light touch bilateral.  Musculoskeletal: Mild tenderness with palpation right and left bunion deformity, mild limitation but no crepitus with range of motion, deformity reducible, tracking not trackbound, there is mild 1st ray hypermobility noted bilateral. Midtarsal, Subtalar joint, and ankle joint range of motion is within normal limits. On weightbearing exam, there is decreased 1st MTPJ rom Right>Left with functional limitus noted, there is medial arch collapse Right> Left on weightbearing, rearfoot slight valgus, forefoot slight abduction with HAV deformity supported on ground with no second toe crossover deformity noted. + pes planus bilateral. Mild lesser hammertoe. .      Assessment and Plan: Problem List Items Addressed This Visit   None   Visit Diagnoses    Bunion    -  Primary   Capsulitis       Hammer toe, unspecified laterality       Pes planus of both feet       Bilateral foot pain          -Complete examination performed -Xrays reviewed; IM measured at 18 deg -Re-Discussed treatement options; discussed HAV and hammertoe deformity;conservative and  Surgical management; risks, benefits, alternatives discussed. All patient's questions answered. -Patient opt for surgical management. Consent obtained for R bunionectomy with 1st toe osteotomy ([id number]-akin). Pre and Post op course explained. Risks, benefits, alternatives explained. No guarantees given or implied. Surgical booking slip submitted and provided patient with Surgical packet and info for [facility] -To be nonweightbearing with CAM boot and crutches 6 weeks -Meanwhile recommend continue with good supportive shoes and inserts.  -Patient to return to office as  scheduled after surgery or sooner if condition worsens.  Landis Martins, DPM

## 2020-02-02 ENCOUNTER — Telehealth: Payer: Self-pay

## 2020-02-02 NOTE — Telephone Encounter (Signed)
Received surgery paperwork from Dr. Marylene Land. Left a message for [facility] to call and schedule surgery.

## 2022-06-25 ENCOUNTER — Encounter (HOSPITAL_COMMUNITY): Payer: Self-pay | Admitting: Emergency Medicine

## 2022-06-25 ENCOUNTER — Other Ambulatory Visit: Payer: Self-pay

## 2022-06-25 ENCOUNTER — Ambulatory Visit (HOSPITAL_COMMUNITY)
Admission: EM | Admit: 2022-06-25 | Discharge: 2022-06-25 | Disposition: A | Discharge status: Home / Self Care | Payer: BC Managed Care – PPO | Attending: Emergency Medicine | Admitting: Emergency Medicine

## 2022-06-25 DIAGNOSIS — H109 Unspecified conjunctivitis: Secondary | ICD-10-CM

## 2022-06-25 MED ORDER — POLYMYXIN B-TRIMETHOPRIM 10000-0.1 UNIT/ML-% OP SOLN: 1.0000 [drp] | OPHTHALMIC | 0 refills | Status: DC

## 2022-06-25 MED ORDER — POLYMYXIN B-TRIMETHOPRIM 10000-0.1 UNIT/ML-% OP SOLN: 1.0000 [drp] | OPHTHALMIC | 0 refills | Status: AC

## 2022-06-25 NOTE — Discharge Instructions (Addendum)
I am covering you with antibiotic eyedrops for potential bacterial conjunctivitis.  Please take the drops as prescribed.  Please ensure you are washing your hands frequently.  Please wash your hands before and after applying the drops.  If your symptoms spread to your right eye, you can use the antibiotic drops there as well.  Return to clinic or follow-up with your primary care for any new or concerning symptoms.

## 2022-06-25 NOTE — ED Triage Notes (Addendum)
Noticed left eye redness 2 hours ago.  History of the same.  Denies changes in vision.  States "no symptoms", just noticed redness.  Does not wear contacts

## 2022-06-25 NOTE — ED Provider Notes (Signed)
[facility]    CSN: [id number] Arrival date & time: 06/25/22  4098      History   Chief Complaint Chief Complaint  Patient presents with   Eye Problem    HPI [name] is a 47 y.o. female.   Patient presents to clinic for left eye redness and scant discharge that started this morning.  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  She does not wear contacts or glasses.  Denies any foreign bodies, itching, vision changes or pain.    The history is provided by the patient and medical records.  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    Patient Active Problem List   Diagnosis Date Noted   Abnormal glucose level 12/15/2019   Allergic rhinitis 12/15/2019   Hyperlipidemia 12/15/2019   Uterine leiomyoma 12/15/2019   Vitamin D deficiency 12/15/2019   S/P myomectomy 05/10/2015    Past Surgical History:  Procedure Laterality Date   MICROLARYNGOSCOPY N/A 11/30/2014   Procedure: MICROLARYNGOSCOPY WITH EXCISION OF VOCAL CORD LESION;  Surgeon: Serena Colonel, MD;  Location: [facility];  Service: ENT;  Laterality: N/A;   MYOMECTOMY N/A 05/10/2015   Procedure: Exploratory Laparotomy MYOMECTOMY;  Surgeon: Maxie Better, MD;  Location: [facility];  Service: Gynecology;  Laterality: N/A;   NO PAST SURGERIES      OB History   No obstetric history on file.      Home Medications    Prior to Admission medications   Medication Sig Start Date End Date Taking? Authorizing Provider  trimethoprim-polymyxin b (POLYTRIM) ophthalmic solution Place 1 drop into the left eye every 4 (four) hours. 06/25/22   Teryl Gubler, [place] N, FNP    Family History History reviewed. No pertinent family history.  Social  History Social History   Tobacco Use   Smoking status: Never   Smokeless tobacco: Never  Vaping Use   Vaping Use: Never used  Substance Use Topics   Alcohol use: No   Drug use: No     Allergies   No known allergies   Review of Systems Review of Systems  Eyes:  Positive for redness. Negative for photophobia, pain, discharge, itching and visual disturbance.     Physical Exam Triage Vital Signs ED Triage Vitals  Enc Vitals Group     BP 06/25/22 1049 116/69     Pulse Rate 06/25/22 1049 66     Resp 06/25/22 1049 18     Temp 06/25/22 1049 98.3 F (36.8 C)     Temp Source 06/25/22 1049 Oral     SpO2 06/25/22 1049 97 %     Weight --      Height --      Head Circumference --      Peak Flow --      Pain Score 06/25/22 1047 0     Pain Loc --      Pain Edu? --      Excl. in GC? --    No data found.  Updated Vital Signs BP 116/69 (BP Location: Left Arm) Comment (BP Location): large cuff  Pulse 66   Temp 98.3 F (36.8 C) (Oral)   Resp 18   LMP 06/23/2022   SpO2 97%  Visual Acuity Right Eye Distance:   Left Eye Distance:   Bilateral Distance:    Right Eye Near:   Left Eye Near:    Bilateral Near:     Physical Exam Vitals and nursing note reviewed.  Constitutional:      Appearance: Normal appearance.  HENT:     Head: Normocephalic and atraumatic.     Right Ear: External ear normal.     Left Ear: External ear normal.     Nose: Nose normal.     Mouth/Throat:     Mouth: Mucous membranes are moist.  Eyes:     General: Lids are normal. Lids are everted, no foreign bodies appreciated. Vision grossly intact. Gaze aligned appropriately.        Right eye: No discharge.        Left eye: Discharge present.    Conjunctiva/sclera:     Left eye: Left conjunctiva is injected.     Pupils: Pupils are equal, round, and reactive to light.  Cardiovascular:     Rate and Rhythm: Normal rate.  Pulmonary:     Effort: Pulmonary effort is normal. No respiratory distress.   Neurological:     General: No focal deficit present.     Mental Status: She is alert and oriented to person, place, and time.  Psychiatric:        Mood and Affect: Mood normal.        Behavior: Behavior normal. Behavior is cooperative.      UC Treatments / Results  Labs (all labs ordered are listed, but only abnormal results are displayed) Labs Reviewed - No data to display  EKG   Radiology No results found.  Procedures Procedures (including critical care time)  Medications Ordered in UC Medications - No data to display  Initial Impression / Assessment and Plan / UC Course  I have reviewed the triage vital signs and the nursing notes.  Pertinent labs & imaging results that were available during my care of the patient were reviewed by me and considered in my medical decision making (see chart for details).  Vitals in triage reviewed, patient is hemodynamically stable.  Left eye with redness and scant discharge dried to lashes, patient reports frequent pinkeye and exposure.  Will cover for bacterial conjunctivitis with Polytrim.  Without any red flag symptoms of vision changes, foreign body, does not wear contacts.  Plan of care, follow-up care and return precautions reviewed, no questions at this time.     Final Clinical Impressions(s) / UC Diagnoses   Final diagnoses:  Conjunctivitis of left eye, unspecified conjunctivitis type     Discharge Instructions      I am covering you with antibiotic eyedrops for potential bacterial conjunctivitis.  Please take the drops as prescribed.  Please ensure you are washing your hands frequently.  Please wash your hands before and after applying the drops.  If your symptoms spread to your right eye, you can use the antibiotic drops there as well.  Return to clinic or follow-up with your primary care for any new or concerning symptoms.     ED Prescriptions     Medication Sig Dispense Auth. Provider   trimethoprim-polymyxin b  (POLYTRIM) ophthalmic solution  (Status: Discontinued) Place 1 drop into the left eye every 4 (four) hours. 10 mL Monik Lins, [place] N, FNP   trimethoprim-polymyxin b (POLYTRIM) ophthalmic solution Place 1 drop into the left eye every 4 (four) hours. 10 mL Shinika Estelle, [place] N, FNP  PDMP not reviewed this encounter.   Teandre Hamre, [place] N, [place] 06/25/22 1104
